# Patient Record
Sex: Female | Born: 1960
Health system: Southern US, Community
[De-identification: ages and names within clinical notes are randomized; demographics above are authoritative.]

## PROBLEM LIST (undated history)

## (undated) DIAGNOSIS — R87619 Unspecified abnormal cytological findings in specimens from cervix uteri: Secondary | ICD-10-CM

## (undated) DIAGNOSIS — Z5189 Encounter for other specified aftercare: Secondary | ICD-10-CM

## (undated) DIAGNOSIS — T7840XA Allergy, unspecified, initial encounter: Secondary | ICD-10-CM

## (undated) DIAGNOSIS — F419 Anxiety disorder, unspecified: Secondary | ICD-10-CM

## (undated) DIAGNOSIS — R58 Hemorrhage, not elsewhere classified: Secondary | ICD-10-CM

## (undated) DIAGNOSIS — Z9889 Other specified postprocedural states: Secondary | ICD-10-CM

## (undated) HISTORY — PX: WISDOM TOOTH EXTRACTION: SHX21

## (undated) HISTORY — DX: Hemorrhage, not elsewhere classified: R58

## (undated) HISTORY — DX: Anxiety disorder, unspecified: F41.9

## (undated) HISTORY — DX: Other specified postprocedural states: Z98.890

## (undated) HISTORY — DX: Encounter for other specified aftercare: Z51.89

## (undated) HISTORY — PX: TUBAL LIGATION: SHX77

## (undated) HISTORY — DX: Unspecified abnormal cytological findings in specimens from cervix uteri: R87.619

## (undated) HISTORY — DX: Allergy, unspecified, initial encounter: T78.40XA

## (undated) HISTORY — PX: COLONOSCOPY: SHX174

---

## 1984-05-07 DIAGNOSIS — Z9889 Other specified postprocedural states: Secondary | ICD-10-CM

## 1984-05-07 HISTORY — DX: Other specified postprocedural states: Z98.890

## 2003-12-06 ENCOUNTER — Encounter: Payer: Self-pay | Admitting: Family Medicine

## 2003-12-06 LAB — CONVERTED CEMR LAB: Pap Smear: NORMAL

## 2005-01-16 ENCOUNTER — Encounter: Payer: Self-pay | Admitting: Family Medicine

## 2005-02-09 ENCOUNTER — Encounter: Payer: Self-pay | Admitting: Family Medicine

## 2005-02-09 ENCOUNTER — Encounter (INDEPENDENT_AMBULATORY_CARE_PROVIDER_SITE_OTHER): Payer: Self-pay | Admitting: *Deleted

## 2005-02-09 ENCOUNTER — Ambulatory Visit (HOSPITAL_COMMUNITY): Admission: RE | Admit: 2005-02-09 | Discharge: 2005-02-09 | Payer: Self-pay | Admitting: Gastroenterology

## 2005-07-17 ENCOUNTER — Emergency Department (HOSPITAL_COMMUNITY): Admission: EM | Admit: 2005-07-17 | Discharge: 2005-07-17 | Payer: Self-pay | Admitting: Family Medicine

## 2006-05-17 ENCOUNTER — Ambulatory Visit: Payer: Self-pay | Admitting: Family Medicine

## 2006-05-17 LAB — CONVERTED CEMR LAB
AST: 19 units/L (ref 0–37)
CO2: 29 meq/L (ref 19–32)
Chol/HDL Ratio, serum: 2.8
Cholesterol: 176 mg/dL (ref 0–200)
GFR calc non Af Amer: 73 mL/min
Glomerular Filtration Rate, Af Am: 88 mL/min/{1.73_m2}
HDL: 62.5 mg/dL (ref >39.0)
Potassium: 3.7 meq/L (ref 3.5–5.1)
Triglyceride fasting, serum: 48 mg/dL (ref 0–149)

## 2006-05-27 ENCOUNTER — Ambulatory Visit (HOSPITAL_COMMUNITY): Admission: RE | Admit: 2006-05-27 | Discharge: 2006-05-27 | Payer: Self-pay | Admitting: Family Medicine

## 2006-06-05 ENCOUNTER — Encounter: Admission: RE | Admit: 2006-06-05 | Discharge: 2006-06-05 | Payer: Self-pay | Admitting: Family Medicine

## 2006-12-02 ENCOUNTER — Encounter: Admission: RE | Admit: 2006-12-02 | Discharge: 2006-12-02 | Payer: Self-pay | Admitting: Family Medicine

## 2007-02-28 ENCOUNTER — Encounter: Payer: Self-pay | Admitting: Family Medicine

## 2007-02-28 DIAGNOSIS — R87619 Unspecified abnormal cytological findings in specimens from cervix uteri: Secondary | ICD-10-CM | POA: Insufficient documentation

## 2007-02-28 DIAGNOSIS — Z8719 Personal history of other diseases of the digestive system: Secondary | ICD-10-CM | POA: Insufficient documentation

## 2007-06-19 ENCOUNTER — Ambulatory Visit: Payer: Self-pay | Admitting: Family Medicine

## 2007-06-19 DIAGNOSIS — R1013 Epigastric pain: Secondary | ICD-10-CM | POA: Insufficient documentation

## 2007-06-20 ENCOUNTER — Ambulatory Visit: Payer: Self-pay | Admitting: Family Medicine

## 2007-06-20 ENCOUNTER — Ambulatory Visit (HOSPITAL_COMMUNITY): Admission: RE | Admit: 2007-06-20 | Discharge: 2007-06-20 | Payer: Self-pay | Admitting: Family Medicine

## 2007-06-20 LAB — CONVERTED CEMR LAB
AST: 17 units/L (ref 0–37)
Albumin: 4.3 g/dL (ref 3.5–5.2)
BUN: 8 mg/dL (ref 6–23)
Basophils Absolute: 0 10*3/uL (ref 0.0–0.1)
Basophils Relative: 0.4 % (ref 0.0–1.0)
Calcium: 9.6 mg/dL (ref 8.4–10.5)
Chloride: 102 meq/L (ref 96–112)
Creatinine, Ser: 0.9 mg/dL (ref 0.4–1.2)
Eosinophils Absolute: 0.1 10*3/uL (ref 0.0–0.6)
Eosinophils Relative: 0.9 % (ref 0.0–5.0)
Hemoglobin: 14.5 g/dL (ref 12.0–15.0)
MCHC: 33.6 g/dL (ref 30.0–36.0)
MCV: 91 fL (ref 78.0–100.0)
Monocytes Absolute: 0.4 10*3/uL (ref 0.2–0.7)
Monocytes Relative: 7.4 % (ref 3.0–11.0)
Potassium: 3.9 meq/L (ref 3.5–5.1)
RDW: 11.8 % (ref 11.5–14.6)
Sodium: 140 meq/L (ref 135–145)
Total Protein: 7.6 g/dL (ref 6.0–8.3)
WBC: 5.8 10*3/uL (ref 4.5–10.5)

## 2007-06-24 ENCOUNTER — Encounter: Payer: Self-pay | Admitting: Family Medicine

## 2007-07-02 ENCOUNTER — Encounter: Payer: Self-pay | Admitting: Family Medicine

## 2007-07-02 ENCOUNTER — Ambulatory Visit: Payer: Self-pay | Admitting: Family Medicine

## 2007-07-02 ENCOUNTER — Other Ambulatory Visit: Admission: RE | Admit: 2007-07-02 | Discharge: 2007-07-02 | Payer: Self-pay | Admitting: Family Medicine

## 2007-07-15 ENCOUNTER — Ambulatory Visit (HOSPITAL_COMMUNITY): Admission: RE | Admit: 2007-07-15 | Discharge: 2007-07-15 | Payer: Self-pay | Admitting: Family Medicine

## 2007-08-01 ENCOUNTER — Telehealth: Payer: Self-pay | Admitting: Family Medicine

## 2007-08-20 ENCOUNTER — Ambulatory Visit: Payer: Self-pay | Admitting: Family Medicine

## 2007-08-20 DIAGNOSIS — N3000 Acute cystitis without hematuria: Secondary | ICD-10-CM | POA: Insufficient documentation

## 2007-08-20 LAB — CONVERTED CEMR LAB: Urobilinogen, UA: 0.2

## 2007-11-23 ENCOUNTER — Encounter: Payer: Self-pay | Admitting: Family Medicine

## 2007-12-01 ENCOUNTER — Ambulatory Visit: Payer: Self-pay | Admitting: Family Medicine

## 2007-12-01 DIAGNOSIS — G589 Mononeuropathy, unspecified: Secondary | ICD-10-CM | POA: Insufficient documentation

## 2007-12-01 DIAGNOSIS — R071 Chest pain on breathing: Secondary | ICD-10-CM | POA: Insufficient documentation

## 2007-12-31 ENCOUNTER — Ambulatory Visit: Payer: Self-pay | Admitting: Family Medicine

## 2007-12-31 DIAGNOSIS — N63 Unspecified lump in unspecified breast: Secondary | ICD-10-CM | POA: Insufficient documentation

## 2008-01-02 ENCOUNTER — Encounter: Admission: RE | Admit: 2008-01-02 | Discharge: 2008-01-02 | Payer: Self-pay | Admitting: Family Medicine

## 2008-01-27 ENCOUNTER — Ambulatory Visit: Payer: Self-pay | Admitting: Family Medicine

## 2008-01-27 DIAGNOSIS — M549 Dorsalgia, unspecified: Secondary | ICD-10-CM | POA: Insufficient documentation

## 2008-04-14 ENCOUNTER — Ambulatory Visit: Payer: Self-pay | Admitting: Family Medicine

## 2008-04-14 DIAGNOSIS — J019 Acute sinusitis, unspecified: Secondary | ICD-10-CM | POA: Insufficient documentation

## 2008-07-07 ENCOUNTER — Ambulatory Visit: Payer: Self-pay | Admitting: Family Medicine

## 2008-07-07 DIAGNOSIS — R5383 Other fatigue: Secondary | ICD-10-CM | POA: Insufficient documentation

## 2008-07-07 DIAGNOSIS — J309 Allergic rhinitis, unspecified: Secondary | ICD-10-CM | POA: Insufficient documentation

## 2008-07-07 DIAGNOSIS — R5381 Other malaise: Secondary | ICD-10-CM | POA: Insufficient documentation

## 2008-07-08 ENCOUNTER — Ambulatory Visit: Payer: Self-pay | Admitting: Family Medicine

## 2008-07-14 ENCOUNTER — Encounter: Payer: Self-pay | Admitting: Family Medicine

## 2008-07-14 LAB — CONVERTED CEMR LAB
ALT: 12 units/L (ref 0–35)
AST: 17 units/L (ref 0–37)
BUN: 7 mg/dL (ref 6–23)
Basophils Absolute: 0 10*3/uL (ref 0.0–0.1)
Basophils Relative: 0.7 % (ref 0.0–3.0)
Bilirubin, Direct: 0.1 mg/dL (ref 0.0–0.3)
Calcium: 9.2 mg/dL (ref 8.4–10.5)
Chloride: 105 meq/L (ref 96–112)
Cholesterol: 206 mg/dL (ref 0–200)
Creatinine, Ser: 0.8 mg/dL (ref 0.4–1.2)
Direct LDL: 125.7 mg/dL
Eosinophils Relative: 2.7 % (ref 0.0–5.0)
GFR calc Af Amer: 100 mL/min
GFR calc non Af Amer: 82 mL/min
Glucose, Bld: 95 mg/dL (ref 70–99)
HDL: 58.7 mg/dL (ref >39.0)
Hemoglobin: 14 g/dL (ref 12.0–15.0)
Lymphocytes Relative: 42.2 % (ref 12.0–46.0)
Monocytes Relative: 8.1 % (ref 3.0–12.0)
Neutrophils Relative %: 46.3 % (ref 43.0–77.0)
Platelets: 221 10*3/uL (ref 150–400)
TSH: 2.92 microintl units/mL (ref 0.35–5.50)
Total Bilirubin: 0.9 mg/dL (ref 0.3–1.2)
Total Protein: 6.6 g/dL (ref 6.0–8.3)
Vit D, 25-Hydroxy: 25 ng/mL — ABNORMAL LOW (ref 30–89)
Vitamin B-12: 338 pg/mL (ref 211–911)
WBC: 3.7 10*3/uL — ABNORMAL LOW (ref 4.5–10.5)

## 2008-07-29 ENCOUNTER — Ambulatory Visit: Payer: Self-pay | Admitting: Family Medicine

## 2008-07-29 LAB — CONVERTED CEMR LAB
Eosinophils Absolute: 0.1 10*3/uL (ref 0.0–0.7)
Eosinophils Relative: 1.8 % (ref 0.0–5.0)
Lymphs Abs: 1.5 10*3/uL (ref 0.7–4.0)
MCHC: 33.7 g/dL (ref 30.0–36.0)
Monocytes Absolute: 0.4 10*3/uL (ref 0.1–1.0)

## 2008-09-27 ENCOUNTER — Other Ambulatory Visit: Admission: RE | Admit: 2008-09-27 | Discharge: 2008-09-27 | Payer: Self-pay | Admitting: Family Medicine

## 2008-09-27 ENCOUNTER — Encounter: Payer: Self-pay | Admitting: Family Medicine

## 2008-09-27 ENCOUNTER — Ambulatory Visit: Payer: Self-pay | Admitting: Family Medicine

## 2008-10-19 ENCOUNTER — Ambulatory Visit (HOSPITAL_COMMUNITY): Admission: RE | Admit: 2008-10-19 | Discharge: 2008-10-19 | Payer: Self-pay | Admitting: Family Medicine

## 2008-10-20 ENCOUNTER — Telehealth: Payer: Self-pay | Admitting: Family Medicine

## 2008-10-20 ENCOUNTER — Ambulatory Visit: Payer: Self-pay | Admitting: Family Medicine

## 2009-02-04 ENCOUNTER — Ambulatory Visit: Payer: Self-pay | Admitting: Family Medicine

## 2009-08-03 ENCOUNTER — Telehealth: Payer: Self-pay | Admitting: Family Medicine

## 2009-11-24 ENCOUNTER — Ambulatory Visit (HOSPITAL_COMMUNITY): Admission: RE | Admit: 2009-11-24 | Discharge: 2009-11-24 | Payer: Self-pay | Admitting: Family Medicine

## 2010-03-22 ENCOUNTER — Encounter (INDEPENDENT_AMBULATORY_CARE_PROVIDER_SITE_OTHER): Payer: Self-pay | Admitting: *Deleted

## 2010-03-24 ENCOUNTER — Ambulatory Visit: Payer: Self-pay | Admitting: Internal Medicine

## 2010-04-07 ENCOUNTER — Ambulatory Visit: Payer: Self-pay | Admitting: Internal Medicine

## 2010-05-28 ENCOUNTER — Encounter: Payer: Self-pay | Admitting: Family Medicine

## 2010-05-29 ENCOUNTER — Encounter: Payer: Self-pay | Admitting: Family Medicine

## 2010-06-06 NOTE — Procedures (Signed)
Summary: Colonoscopy and Polypectomy by Dr.James Edwards,[facility]  Colonoscopy and Polypectomy by Dr.James Edwards,[facility]   Imported By: Beau Fanny 04/06/2010 10:50:29  _____________________________________________________________________  External Attachment:    Type:   Image     Comment:   External Document

## 2010-06-06 NOTE — Letter (Signed)
Summary: Dr.James Edward,[facility],Note  Dr.James Edward,[facility],Note   Imported By: Beau Fanny 04/06/2010 10:51:13  _____________________________________________________________________  External Attachment:    Type:   Image     Comment:   External Document

## 2010-06-06 NOTE — Progress Notes (Signed)
Summary: allegra  Phone Note Call from Patient   Caller: Patient Call For: Kerby Nora MD Summary of Call: Patient would like rx for allergra so that flex spending will pay Initial call taken by: Benny Lennert CMA Duncan Dull),  August 03, 2009 9:39 AM  Follow-up for Phone Call        Okay for year supply #30.  Follow-up by: Kerby Nora MD,  August 03, 2009 1:02 PM  Additional Follow-up for Phone Call Additional follow up Details #1::        done and patient notified Additional Follow-up by: Benny Lennert CMA Duncan Dull),  August 03, 2009 2:14 PM    New/Updated Medications: ALLEGRA 180 MG TABS (FEXOFENADINE HCL) take one tablet daily Prescriptions: ALLEGRA 180 MG TABS (FEXOFENADINE HCL) take one tablet daily  #30 x 11   Entered by:   Benny Lennert CMA (AAMA)   Authorized by:   Kerby Nora MD   Signed by:   Benny Lennert CMA (AAMA) on 08/03/2009   Method used:   [facility]        [facility]  [address] [id number]       [address]       [place], [place]  [address]       Ph: [phone] or [phone]       Fax: [phone]   RxID:   [id number]   Prior Medications: KETOCONAZOLE 2 % CREA (KETOCONAZOLE) Apply a small amount to affected area twice a day as needed CALTRATE 600 1500 MG  TABS (CALCIUM CARBONATE) 1 by mouth two times a day ZANAFLEX 4 MG TABS (TIZANIDINE HCL) 1 by mouth qhs DICLOFENAC SODIUM 75 MG TBEC (DICLOFENAC SODIUM) 1 by mouth two times a day CALCIUM CARBONATE-VITAMIN D 600-400 MG-UNIT  TABS (CALCIUM CARBONATE-VITAMIN D) Take 1 tablet by mouth once a day RHINOCORT AQUA 32 MCG/ACT SUSP (BUDESONIDE) 2 sprays per nostril  daily VITAMIN D 50000 UNIT CAPS (ERGOCALCIFEROL) Take one capsule by mouth once a week Current Allergies: ! CEFTIN CEFTIN (CEFUROXIME AXETIL)

## 2010-06-06 NOTE — Letter (Signed)
Summary: [facility] Instructions  [facility]  [address] [place], [place] [address]   Phone: [phone]  Fax: [phone]       [name]    February 17, 1961    MRN: [id number]       Procedure Day /Date:  Friday 04/07/2010     Arrival Time: 8:30 am     Procedure Time:  9:30 am     Location of Procedure:                    _x _  [facility] (4th Floor)   PREPARATION FOR COLONOSCOPY WITH MIRALAX  Starting 5 days prior to your procedure Sunday 11/27 do not eat nuts, seeds, popcorn, corn, beans, peas,  salads, or any raw vegetables.  Do not take any fiber supplements (e.g. Metamucil, Citrucel, and Benefiber). ____________________________________________________________________________________________________   THE DAY BEFORE YOUR PROCEDURE         DATE: Thursday 12/1  1   Drink clear liquids the entire day-NO SOLID FOOD  2   Do not drink anything colored red or purple.  Avoid juices with pulp.  No orange juice.  3   Drink at least 64 oz. (8 glasses) of fluid/clear liquids during the day to prevent dehydration and help the prep work efficiently.  CLEAR LIQUIDS INCLUDE: Water Jello Ice Popsicles Tea (sugar ok, no milk/cream) Powdered fruit flavored drinks Coffee (sugar ok, no milk/cream) Gatorade Juice: apple, white grape, white cranberry  Lemonade Clear bullion, consomm, broth Carbonated beverages (any kind) Strained chicken noodle soup Hard Candy  4   Mix the entire bottle of Miralax with 64 oz. of Gatorade/Powerade in the morning and put in the refrigerator to chill.  5   At 3:00 pm take 2 Dulcolax/Bisacodyl tablets.  6   At 4:30 pm take one Reglan/Metoclopramide tablet.  7  Starting at 5:00 pm drink one 8 oz glass of the Miralax mixture every 15-20 minutes until you have finished drinking the entire 64 oz.  You should finish drinking prep around 7:30 or 8:00 pm.  8   If you are nauseated, you may take the 2nd Reglan/Metoclopramide tablet  at 6:30 pm.        9    At 8:00 pm take 2 more DULCOLAX/Bisacodyl tablets.     THE DAY OF YOUR PROCEDURE      DATE:  Friday 12/2  You may drink clear liquids until 7:30 am  (2 HOURS BEFORE PROCEDURE).   MEDICATION INSTRUCTIONS  Unless otherwise instructed, you should take regular prescription medications with a small sip of water as early as possible the morning of your procedure.            OTHER INSTRUCTIONS  You will need a responsible adult at least 48 years of age to accompany you and drive you home.   This person must remain in the waiting room during your procedure.  Wear loose fitting clothing that is easily removed.  Leave jewelry and other valuables at home.  However, you may wish to bring a book to read or an iPod/MP3 player to listen to music as you wait for your procedure to start.  Remove all body piercing jewelry and leave at home.  Total time from sign-in until discharge is approximately 2-3 hours.  You should go home directly after your procedure and rest.  You can resume normal activities the day after your procedure.  The day of your procedure you should not:   Drive  Make legal decisions   Operate machinery   Drink alcohol   Return to work  You will receive specific instructions about eating, activities and medications before you leave.   The above instructions have been reviewed and explained to me by  Sherren Kerns RN  March 24, 2010 4:37 PM     I fully understand and can verbalize these instructions _____________________________ Date _______

## 2010-06-06 NOTE — Miscellaneous (Signed)
Summary: previsit prep/rm  Clinical Lists Changes  Medications: Added new medication of MIRALAX   POWD (POLYETHYLENE GLYCOL 3350) As per prep  instructions. - Signed Added new medication of DULCOLAX 5 MG  TBEC (BISACODYL) Day before procedure take 2 at 3pm and 2 at 8pm. - Signed Added new medication of REGLAN 10 MG  TABS (METOCLOPRAMIDE HCL) As per prep instructions. - Signed Rx of MIRALAX   POWD (POLYETHYLENE GLYCOL 3350) As per prep  instructions.;  #255gm x 0;  Signed;  Entered by: Sherren Kerns RN;  Authorized by: Hart Carwin MD;  Method used: [facility] [facility] [address], [place], [place]  [address], Ph: [phone] or [phone], Fax: [phone] Rx of DULCOLAX 5 MG  TBEC (BISACODYL) Day before procedure take 2 at 3pm and 2 at 8pm.;  #4 x 0;  Signed;  Entered by: Sherren Kerns RN;  Authorized by: Hart Carwin MD;  Method used: [facility]  [address] [place], [place]  [address], Ph: [phone] or [phone], Fax: [phone] Rx of REGLAN 10 MG  TABS (METOCLOPRAMIDE HCL) As per prep instructions.;  #2 x 0;  Signed;  Entered by: Sherren Kerns RN;  Authorized by: Hart Carwin MD;  Method used: [facility] [place], [place]  [address], Ph: [phone] or [phone], Fax: [phone] Observations: Added new observation of ALLERGY REV: Done (03/24/2010 16:17)    Prescriptions: REGLAN 10 MG  TABS (METOCLOPRAMIDE HCL) As per prep instructions.  #2 x 0   Entered by:   Sherren Kerns RN   Authorized by:   Hart Carwin MD   Signed by:   Sherren Kerns RN on 03/24/2010   Method used:   [facility]ron[facility]        [facility] (retail)       [address]       [place], [place]  [address]       Ph: [phone] or [phone]       Fax: [phone]   RxID:   [id number] DULCOLAX 5 MG  TBEC (BISACODYL) Day before procedure take 2 at 3pm and 2 at 8pm.  #4  x 0   Entered by:   Sherren Kerns RN   Authorized by:   Hart Carwin MD   Signed by:   Sherren Kerns RN on 03/24/2010   Method used:   [facility]ronically to        [facility]  [address] (retail)       [address]       [place], [place]  [address]       Ph: [phone] or [phone]       Fax: [phone]   RxID:   [id number] MIRALAX   POWD (POLYETHYLENE GLYCOL 3350) As per prep  instructions.  #255gm x 0   Entered by:   Sherren Kerns RN   Authorized by:   Hart Carwin MD   Signed by:   Sherren Kerns RN on 03/24/2010   Method used:   [facility]        [facility] (retail)       [address]       [place], [place]  [address]       Ph: [phone] or [phone]       Fax: [phone]   RxID:   [id number]

## 2010-06-06 NOTE — Procedures (Signed)
Summary: Colonoscopy  Patient: [name] Note: All result statuses are Final unless otherwise noted.  Tests: (1) Colonoscopy (COL)   COL Colonoscopy           DONE (C)     [facility]     [address]     [place], [place]  [address]           COLONOSCOPY PROCEDURE REPORT           PATIENT:  [name], [name]  MR#:  [id number]     BIRTHDATE:  29-May-1960, 49 yrs. old  GENDER:  female     ENDOSCOPIST:  Hedwig Morton. Juanda Chance, MD     REF. BY:  Excell Seltzer, M.D.     PROCEDURE DATE:  04/07/2010     PROCEDURE:  Colonoscopy [address]     ASA CLASS:  Class I     INDICATIONS:  family history of colon cancer, Hx of polyps on     colonoscopy 5 years ago,  mat aunt with colon cancer,     MEDICATIONS:   Versed 10 mg, Fentanyl 100 mcg           DESCRIPTION OF PROCEDURE:   After the risks benefits and     alternatives of the procedure were thoroughly explained, informed     consent was obtained.  Digital rectal exam was performed and     revealed no rectal masses.   The LB [id number] endoscope     was introduced through the anus and advanced to the cecum, which     was identified by both the appendix and ileocecal valve, without     limitations.  The quality of the prep was excellent, using     MiraLax.  The instrument was then slowly withdrawn as the colon     was fully examined.     <<PROCEDUREIMAGES>>           FINDINGS:  No polyps or cancers were seen (see image1, image2,     image3, image4, and image5).   Retroflexed views in the rectum     revealed no abnormalities.    The scope was then withdrawn from     the patient and the procedure completed.           COMPLICATIONS:  None     ENDOSCOPIC IMPRESSION:     1) No polyps or cancers     2) Normal colonoscopy     RECOMMENDATIONS:     1) high fiber diet     REPEAT EXAM:  In 10 year(s) for.           ______________________________     Hedwig Morton. Juanda Chance, MD           CC:           n.     REVISED:  04/07/2010 09:38 AM  eSIGNED:   Hedwig Morton. Brodie at 04/07/2010 09:38 AM           Carrie Mew, [id number]  Note: An exclamation mark (!) indicates a result that was not dispersed into the flowsheet. Document Creation Date: 04/07/2010 9:38 AM _______________________________________________________________________  (1) Order result status: Final Collection or observation date-time: 04/07/2010 09:21 Requested date-time:  Receipt date-time:  Reported date-time:  Referring Physician:   Ordering Physician: Lina Sar [id number]) Specimen Source:  Source: Launa Grill Order Number: [id number] Lab site:   Appended Document: Colonoscopy    Clinical Lists Changes  Observations: Added new observation of COLONNXTDUE:  04/2020 (04/07/2010 13:14)     

## 2010-07-12 ENCOUNTER — Encounter: Payer: Self-pay | Admitting: Family Medicine

## 2010-07-12 ENCOUNTER — Ambulatory Visit (INDEPENDENT_AMBULATORY_CARE_PROVIDER_SITE_OTHER): Payer: Self-pay | Admitting: Family Medicine

## 2010-07-12 DIAGNOSIS — J069 Acute upper respiratory infection, unspecified: Secondary | ICD-10-CM

## 2010-07-13 ENCOUNTER — Encounter (INDEPENDENT_AMBULATORY_CARE_PROVIDER_SITE_OTHER): Payer: Self-pay | Admitting: *Deleted

## 2010-07-13 ENCOUNTER — Encounter: Payer: Self-pay | Admitting: Family Medicine

## 2010-07-13 DIAGNOSIS — J329 Chronic sinusitis, unspecified: Secondary | ICD-10-CM

## 2010-07-18 NOTE — Assessment & Plan Note (Signed)
Summary: Recheck-feels worse   Vital Signs:  Patient profile:   50 year old female Weight:      144 pounds O2 Sat:      97 % on Room air Pulse rate:   120 / minute Pulse rhythm:   regular BP supine:   130 / 88  Vitals Entered By: Selena Batten Dance CMA Duncan Dull) (July 13, 2010 8:37 AM)  O2 Flow:  Room air CC: Feels worse than yesterday   History of Present Illness: CC: not better  seen ysterday with dx URI.  today worsening cough productive of yellow sputum, worsening head congestion and facial pain and purulent discharge.  + chest pain with cough.  no fever.  tessalon helping some.  has not filled cheratussin.  Allergies: 1)  ! Ceftin 2)  Ceftin (Cefuroxime Axetil)  Review of Systems       per HPI  Physical Exam  General:  tired, some congestion Head:  Normocephalic and atraumatic without obvious abnormalities. No apparent alopecia or balding.  no sinus tenderness Eyes:  PERRLA, EOMI, no injection Mouth:  MMM, mild soft palate erythema L side, no pharyngeal erythema.  mild PND Neck:  no carotid bruit or thyromegaly  no cervical or supraclavicular lymphadenopathy  Lungs:  Normal respiratory effort, chest expands symmetrically. Lungs are clear to auscultation, no crackles or wheezes. Heart:  Normal rate and regular rhythm. S1 and S2 normal without gallop, murmur, click, rub or other extra sounds.  tachycardic Pulses:  2+ rad pulses bilaterally, brisk cap refill   Impression & Recommendations:  Problem # 1:  SINUSITIS (ICD-473.9)  developing sinusitis/bronchitis.  as acutely worse, cover with zpack.  advised to call us tomorrow with update, use mucinex and nasal saline.  out of work, push fluids and rest.  Her updated medication list for this problem includes:    Tessalon Perles 100 Mg Caps (Benzonatate) .Marland Kitchen... Take one by mouth three times a day as needed cough, swallow don't chew    Cheratussin Ac 100-10 Mg/28ml Syrp (Guaifenesin-codeine) ..... One teaspoon q6 hours as  needed cough    Zithromax Z-pak 250 Mg Tabs (Azithromycin) ..... Use as directed  Orders: No Charge Patient Arrived (NCPA0) (NCPA0)  Complete Medication List: 1)  Allegra 180 Mg Tabs (Fexofenadine hcl) .... Take one tablet daily 2)  Tessalon Perles 100 Mg Caps (Benzonatate) .... Take one by mouth three times a day as needed cough, swallow don't chew 3)  Cheratussin Ac 100-10 Mg/63ml Syrp (Guaifenesin-codeine) .... One teaspoon q6 hours as needed cough 4)  Zithromax Z-pak 250 Mg Tabs (Azithromycin) .... Use as directed  Patient Instructions: 1)  This may be developing sinusitis. 2)  Out of work today and tomorrow - get plenty of rest and fluids. 3)  Call us if not starting to feel better tomorrow. 4)  Try simple mucinex or guaifenesin IR 400mg  with plenty of fluid to help mobilize mucous.  Use nasal saline spray. 5)  take azithromycin (sent to pharmacy). Prescriptions: ZITHROMAX Z-PAK 250 MG TABS (AZITHROMYCIN) use as directed  #1 x 0   Entered and Authorized by:   Eustaquio Boyden  MD   Signed by:   Eustaquio Boyden  MD on 07/13/2010   Method used:   [facility]        [facility])       [address]       [place], [place]  [address]       Ph: [phone] or [phone]  Fax: [phone]   RxID:   [id number]    Orders Added: 1)  No Charge Patient Arrived (NCPA0) [NCPA0]    Current Allergies (reviewed today): ! CEFTIN CEFTIN (CEFUROXIME AXETIL)

## 2010-07-18 NOTE — Letter (Signed)
Summary: Out of Work  [facility]  [address] [place], [place] [address]   Phone: [phone]  Fax: [phone]    July 13, 2010   Employee:  Von Tuft    To Whom It May Concern:   For Medical reasons, please excuse the above named employee from work for the following dates:  Start:  July 13, 2010   End:  July 14, 2010 (May return on July 17, 2010)  If you need additional information, please feel free to contact our office.         Sincerely,       Kim Dance CMA (AAMA) for  Dr, Eustaquio Boyden

## 2010-07-18 NOTE — Assessment & Plan Note (Signed)
Summary: CONGESTION/COUGH/[facility]H   Vital Signs:  Patient profile:   50 year old female Temp:     98.1 degrees F oral Pulse rate:   66 / minute Pulse rhythm:   regular BP sitting:   116 / 70  (left arm) Cuff size:   regular  Vitals Entered By: Selena Batten Dance CMA Duncan Dull) (July 12, 2010 11:33 AM) CC: Congestion/cough   History of Present Illness: CC: cough, pressure  6d h/o ST, HA, occipital.  congestion and cough started more recently (3d ago).  Initially clear coughing, now turning yellow.  Has tried Catering manager, zycam, vit C, echinacia, advil.  + intiially arthralgia but better.  Today noticing sinus pressure.  Out of nose noticing bloody discharge.  Worse at night.  not keeping up at night.  No abd pain, n/v/d, myalgias, rashes.  No fevers/chills.  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  No smokers at home.  + h/o allergies, on allegra, usually well controlled with this..  no asthma.  Current Medications (verified): 1)  Allegra 180 Mg Tabs (Fexofenadine Hcl) .... Take One Tablet Daily  Allergies: 1)  ! Ceftin 2)  Ceftin (Cefuroxime Axetil)  Past History:  Past Medical History: Last updated: 06/19/2007 Current Problems:  GASTROINTESTINAL HEMORRHAGE, HX OF (ICD-V12.79) associated with GI infection ABNORMAL PAP SMEAR (ICD-795.00)    Social History: Last updated: 04/14/2008 Never Smoked Occupation: Futures trader Married 2 kids Alcohol use-no Drug use-no Regular exercise-yes  Review of Systems       per HPI  Physical Exam  General:  Well-developed,well-nourished,in no acute distress; alert,appropriate and cooperative throughout examination Head:  Normocephalic and atraumatic without obvious abnormalities. No apparent alopecia or balding.  no sinus tenderness Eyes:  PERRLA, EOMI, no injection Ears:  TMs clear bilaterally Nose:  nares clear bilaterally Mouth:  MMM, mild soft palate erythema L side, no pharyngeal erythema.  mild  PND Neck:  no carotid bruit or thyromegaly  no cervical or supraclavicular lymphadenopathy  Lungs:  Normal respiratory effort, chest expands symmetrically. Lungs are clear to auscultation, no crackles or wheezes. Heart:  Normal rate and regular rhythm. S1 and S2 normal without gallop, murmur, click, rub or other extra sounds. Pulses:  2+ rad pulses bilaterally, brisk cap refill Extremities:  no edema Skin:  Intact without suspicious lesions or rashes   Impression & Recommendations:  Problem # 1:  VIRAL URI (ICD-465.9) supportive care.  red flags to return discussed.  tessalon at work, cheratussin at night.  The following medications were removed from the medication list:    Diclofenac Sodium 75 Mg Tbec (Diclofenac sodium) .Marland Kitchen... 1 by mouth two times a day Her updated medication list for this problem includes:    Allegra 180 Mg Tabs (Fexofenadine hcl) .Marland Kitchen... Take one tablet daily    Tessalon Perles 100 Mg Caps (Benzonatate) .Marland Kitchen... Take one by mouth three times a day as needed cough, swallow don't chew    Cheratussin Ac 100-10 Mg/33ml Syrp (Guaifenesin-codeine) ..... One teaspoon q6 hours as needed cough  Complete Medication List: 1)  Allegra 180 Mg Tabs (Fexofenadine hcl) .... Take one tablet daily 2)  Tessalon Perles 100 Mg Caps (Benzonatate) .... Take one by mouth three times a day as needed cough, swallow don't chew 3)  Cheratussin Ac 100-10 Mg/68ml Syrp (Guaifenesin-codeine) .... One teaspoon q6 hours as needed cough  Patient Instructions: 1)  Sounds like you have a viral upper respiratory infection. 2)  Antibiotics are not needed for  this.  Viral infections usually take 7-10 days to resolve.  The cough can last 4 weeks to go away. 3)  Please return if you are not improving as expected, or if you have high fevers (>101.5) or difficulty swallowing. 4)  Call clinic with questions.  Pleasure to see you today!  5)  tessalon perls for cough during day. 6)  cheratussin cough syrup at night.   may take during day if not making you too sleepy. Prescriptions: CHERATUSSIN AC 100-10 MG/5ML SYRP (GUAIFENESIN-CODEINE) one teaspoon q6 hours as needed cough  #100cc x 0   Entered and Authorized by:   Eustaquio Boyden  MD   Signed by:   Eustaquio Boyden  MD on 07/12/2010   Method used:   Print then Give to Patient   RxID:   [id number] TESSALON PERLES 100 MG CAPS (BENZONATATE) take one by mouth three times a day as needed cough, swallow don't chew  #45 x 0   Entered and Authorized by:   Eustaquio Boyden  MD   Signed by:   Eustaquio Boyden  MD on 07/12/2010   Method used:   [facility]        [facility] [facility]       [address]       [place], [place]  [address]       Ph: [phone] or [phone]       Fax: [phone]   RxID:   [id number]    Orders Added: 1)  Est. Patient Level III [[address]]    Current Allergies (reviewed today): ! CEFTIN CEFTIN (CEFUROXIME AXETIL)

## 2010-09-22 NOTE — Assessment & Plan Note (Signed)
[facility]                           [facility] OFFICE NOTE   NAME:HAYESKemba, [name]                        MRN:          [id number]  DATE:05/17/2006                            DOB:          1961-04-10    CHIEF COMPLAINT:  50 year old white female here to establish new doctor.   HISTORY OF PRESENT ILLNESS:  [name] was seen by her previous doctors  about 2-3 years ago. She states that she has been in good health since.  Her only concerns today are the following:  1. 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 The blood sugar noted was      146. This was at approximately 3:30pm after she had eaten a normal      sized lunch. She was concerned because she felt that this was      elevated above normal. She denies any increased thirst, increased      urination, and has good levels of energy.  2. Rash, acute: Ever since this summer, she has had recurrence of a      pale, hypopigmented, spotted rash across her chest and abdomen. She      has had this in the past and it was treated by a dermatologist. It      is not itchy and does not bother her.   REVIEW OF SYSTEMS:  No headache, no dizziness, no chest pain, no  shortness, no nausea, vomiting, diarrhea, constipation, or rectal  bleeding.   PAST MEDICAL HISTORY:  1. Abnormal pap, greater than 10 years ago.  2. History of GI bleed, secondary to bacterial infection.   HOSPITALIZATIONS/SURGERIES/PROCEDURES:  1. 1999 echocardiogram negative.  2. NSNBD x3.  3. 1986 cervical biopsy resulting in hemorrhage, biopsy was negative.  4. Mammogram in 2005, negative.  5. Pap smear in 2005, negative.  6. Colonoscopy 02/05/2006, negative.   ALLERGIES:  CEFTIN causes hives.   MEDICATIONS:  None.   SOCIAL HISTORY:  She is a former smoker with a 6-7 pack year history,  but she quit 18 years ago. She drinks a moderate amount of alcohol, 1-2  drinks per  day. 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 She has been married for 18 years happily.  She has three children who are healthy. She does not get any regular  exercise. She eats fairly healthily with fruits, vegetables, and lots of  water and tries to avoid fast food.   FAMILY HISTORY:  Her father is alive at 62 with high cholesterol. Her  mother is alive at age 70 and is healthy. No heart attack in the family  before age 79. She has 2 brothers who are healthy. No coronary artery  disease or high blood pressure in the family. She thinks a maternal aunt  may have had diabetes. A paternal aunt also developed breast cancer at  around age 14, as well as a grandmother's twin sister at around age 86.  A maternal aunt also had  colon cancer. Her paternal grandfather had lung  cancer.'   PHYSICAL EXAMINATION:  VITAL SIGNS: Height 67 inches, weight 135.8  making BMI approximately 21, pulse 88, temperature 98.  GENERAL: Healthy appearing female in no apparent distress.  HEENT: PERRLA, extraocular muscles intact, oral pharynx clear, tympanic  membranes clear, nares clear, no thyromegaly, no lymphadenopathy,  supraclavicular cervical.  CARDIOVASCULAR: Regular rate and rhythm, no murmurs, rubs, or gallops.  Normal PMI, 2+ peripheral pulses, no peripheral edema.  LUNGS: Clear to auscultation bilaterally, no rales, rhonchi, or wheezes.  ABDOMEN: Soft, nontender, normal active bowel sounds, no hepatomegaly or  splenomegaly.  MUSCULOSKELETAL: Strength 5 out of 5 in the upper and lower extremities.  NEUROLOGIC: Cranial nerves 2-12 grossly intact, reflexes 2+ patellar.  Sensation intact in upper and lower extremities.  SKIN: Hypopigmented, macular rash across chest and upper abdomen. No  scale. No erythema.   ASSESSMENT AND PLAN:  1. Concern for diabetes: We will check a fasting blood sugar. She was      reassured though that her blood sugar that day was not  abnormal      given the fact that she was postprandial.  2. Pityriasis versicolor: She will try Ketoconazole 2% cream applied      to the affected area twice daily for 2 weeks. She will let me know      if this does not improve her symptoms.  3. Prevention: She is due for mammogram which was scheduled, as well      as scheduled for a pap smear. She is up-to-date with colonoscopy at      this point in time. She will look into when her last tetanus was      done and I will obtain records from her previous doctor. She is due      for cholesterol and at the same time a fasting blood sugar is      checked we can do this. She was encouraged to get regular exercise      and continue to healthily. I always encouraged her to take calcium      and vitamin D to prevent osteoporosis as she gets older.     Terri Nora, MD  Electronically Signed    AB/MedQ  DD: 05/17/2006  DT: 05/18/2006  Job #: [id number]

## 2010-09-22 NOTE — Op Note (Signed)
NAME:  [name], [name]                 ACCOUNT NO.:  [id number]   MEDICAL RECORD NO.:  [id number]          PATIENT TYPE:  AMB   LOCATION:  ENDO                         FACILITY:  [facility]   PHYSICIAN:  James L. Malon Kindle., M.D.DATE OF BIRTH:  Mar 09, 1961   DATE OF PROCEDURE:  02/09/2005  DATE OF DISCHARGE:                                 OPERATIVE REPORT   PROCEDURE:  Colonoscopy and polypectomy.   MEDICATIONS:  Fentanyl 100 mcg, Versed 10 mg IV, Benadryl 25 mg IV.   INSTRUMENT USED:  Pediatric adjustable colonoscope.   INDICATIONS FOR PROCEDURE:  Rectal bleeding.   DESCRIPTION OF PROCEDURE:  The procedure had been explained to the patient  and consent obtained.  In the left lateral decubitus position, the scope was  inserted and advanced.  The prep was excellent.  We were able to reach the  cecum with minimal difficulty.  The ileocecal valve and appendiceal orifice  was seen, the scope was withdrawn, and the mucosa carefully examined.  On  withdrawal, the ascending colon, transverse colon, descending and sigmoid  colon were seen well.  No diverticulosis, no polyps.  In the mid rectum at  approximately 15 cm from the anal verge, a 1 cm polyp was removed by snare  polypectomy.  This was recovered.  There was no bleeding.  Some internal  hemorrhoids were seen in the rectum upon removal.  The scope was withdrawn.  The patient tolerated the procedure well.   ASSESSMENT:  1.  Rectal polyp, removed, 211.4.  2.  Internal hemorrhoids, 455.0.   PLAN:  Routine post polypectomy instructions.  Will check pathology and will  repeat procedure in five years.           ______________________________  Llana Aliment Malon Kindle., M.D.     Waldron Session  D:  02/09/2005  T:  02/09/2005  Job:  [id number]   cc:   Dale Sumner  Fax: [phone]

## 2010-09-29 ENCOUNTER — Telehealth: Payer: Self-pay | Admitting: Family Medicine

## 2010-09-29 ENCOUNTER — Other Ambulatory Visit (INDEPENDENT_AMBULATORY_CARE_PROVIDER_SITE_OTHER): Payer: 59 | Admitting: Family Medicine

## 2010-09-29 DIAGNOSIS — Z13220 Encounter for screening for lipoid disorders: Secondary | ICD-10-CM

## 2010-09-29 DIAGNOSIS — E559 Vitamin D deficiency, unspecified: Secondary | ICD-10-CM

## 2010-09-29 LAB — COMPREHENSIVE METABOLIC PANEL
ALT: 13 U/L (ref 0–35)
AST: 14 U/L (ref 0–37)
Albumin: 3.7 g/dL (ref 3.5–5.2)
Alkaline Phosphatase: 56 U/L (ref 39–117)
Potassium: 3.9 mEq/L (ref 3.5–5.1)
Sodium: 137 mEq/L (ref 135–145)
Total Bilirubin: 0.3 mg/dL (ref 0.3–1.2)
Total Protein: 6.1 g/dL (ref 6.0–8.3)

## 2010-09-29 LAB — LIPID PANEL
HDL: 56 mg/dL (ref >39.00)
LDL Cholesterol: 99 mg/dL (ref 0–99)
Total CHOL/HDL Ratio: 3
VLDL: 19 mg/dL (ref 0.0–40.0)

## 2010-09-29 NOTE — Telephone Encounter (Signed)
Message copied by Excell Seltzer on Fri Sep 29, 2010  8:08 AM ------      Message from: Liane Comber C      Created: Wed Sep 27, 2010  7:24 AM      Regarding: Cpx labs fri       Please order  future cpx labs for pt's upcomming lab appt.      Thanks      Rodney Booze

## 2010-09-30 ENCOUNTER — Encounter: Payer: Self-pay | Admitting: Family Medicine

## 2010-09-30 LAB — VITAMIN D 25 HYDROXY (VIT D DEFICIENCY, FRACTURES): Vit D, 25-Hydroxy: 29 ng/mL — ABNORMAL LOW (ref 30–89)

## 2010-10-06 ENCOUNTER — Ambulatory Visit (INDEPENDENT_AMBULATORY_CARE_PROVIDER_SITE_OTHER): Payer: 59 | Admitting: Family Medicine

## 2010-10-06 ENCOUNTER — Other Ambulatory Visit (HOSPITAL_COMMUNITY)
Admission: RE | Admit: 2010-10-06 | Discharge: 2010-10-06 | Disposition: A | Discharge status: Home / Self Care | Payer: 59 | Source: Ambulatory Visit | Attending: Family Medicine | Admitting: Family Medicine

## 2010-10-06 ENCOUNTER — Encounter: Payer: Self-pay | Admitting: Family Medicine

## 2010-10-06 VITALS — BP 100/60 | HR 94 | Temp 98.3°F | Ht 67.0 in | Wt 148.0 lb

## 2010-10-06 DIAGNOSIS — Z01419 Encounter for gynecological examination (general) (routine) without abnormal findings: Secondary | ICD-10-CM | POA: Insufficient documentation

## 2010-10-06 DIAGNOSIS — D259 Leiomyoma of uterus, unspecified: Secondary | ICD-10-CM

## 2010-10-06 DIAGNOSIS — Z1159 Encounter for screening for other viral diseases: Secondary | ICD-10-CM | POA: Insufficient documentation

## 2010-10-06 NOTE — Assessment & Plan Note (Addendum)
Asymptomatic. Last US showed 4 cm in 2009, today it feels more like 4 inches. Recommend further eval with [place].

## 2010-10-06 NOTE — Patient Instructions (Addendum)
Calcium 600 mg and vit D3 400 IU once or twice a day. Call about mammogram to schedule.  Continue work on exercise and diet.

## 2010-10-06 NOTE — Progress Notes (Signed)
Subjective:    Patient ID: [name], female    DOB: 1961/01/04, 50 y.o.   MRN: [id number]  HPI The patient is here for annual wellness exam and preventative care.      Review of Systems  Constitutional: Negative for fever, fatigue and unexpected weight change.  HENT: Negative for ear pain, congestion, sore throat, sneezing, trouble swallowing and sinus pressure.   Eyes: Negative for pain and itching.  Respiratory: Negative for cough, shortness of breath and wheezing.   Cardiovascular: Negative for chest pain, palpitations and leg swelling.  Gastrointestinal: Negative for nausea, abdominal pain, diarrhea, constipation and blood in stool.  Genitourinary: Negative for dysuria, hematuria, vaginal discharge, difficulty urinating and menstrual problem.  Skin: Negative for rash.  Neurological: Negative for syncope, weakness, light-headedness, numbness and headaches.  Psychiatric/Behavioral: Negative for confusion and dysphoric mood. The patient is not nervous/anxious.        Objective:   Physical Exam  Constitutional: Vital signs are normal. She appears well-developed and well-nourished. She is cooperative.  Non-toxic appearance. She does not appear ill. No distress.  HENT:  Head: Normocephalic.  Right Ear: Hearing, tympanic membrane, external ear and ear canal normal.  Left Ear: Hearing, tympanic membrane, external ear and ear canal normal.  Nose: Nose normal.  Eyes: Conjunctivae, EOM and lids are normal. Pupils are equal, round, and reactive to light. No foreign bodies found.  Neck: Trachea normal and normal range of motion. Neck supple. Carotid bruit is not present. No mass and no thyromegaly present.  Cardiovascular: Normal rate, regular rhythm, S1 normal, S2 normal, normal heart sounds and intact distal pulses.  Exam reveals no gallop.   No murmur heard. Pulmonary/Chest: Effort normal and breath sounds normal. No respiratory distress. She has no wheezes. She has no rhonchi. She has  no rales.  Abdominal: Soft. Normal appearance and bowel sounds are normal. She exhibits no distension, no fluid wave, no abdominal bruit and no mass. There is no hepatosplenomegaly. There is no tenderness. There is no rebound, no guarding and no CVA tenderness. No hernia.  Genitourinary: Vagina normal. No breast swelling, tenderness, discharge or bleeding. Pelvic exam was performed with patient prone. There is no rash, tenderness or lesion on the right labia. There is no rash, tenderness or lesion on the left labia. Uterus is enlarged. Uterus is not tender. Cervix exhibits no motion tenderness, no discharge and no friability. Right adnexum displays no mass, no tenderness and no fullness. Left adnexum displays no mass, no tenderness and no fullness.       Fibroid palpated in low abdomen, stable in size from last exam.   Lymphadenopathy:    She has no cervical adenopathy.    She has no axillary adenopathy.  Neurological: She is alert. She has normal strength. No cranial nerve deficit or sensory deficit.  Skin: Skin is warm, dry and intact. No rash noted.  Psychiatric: Her speech is normal and behavior is normal. Judgment normal. Her mood appears not anxious. Cognition and memory are normal. She does not exhibit a depressed mood.          Assessment & Plan  Complete Physcial Exam: The patient's preventative maintenance and recommended screening tests for an annual wellness exam were reviewed in full today. Brought up to date unless services declined.  Counselled on the importance of diet, exercise, and its role in overall health and mortality. The patient's FH and SH was reviewed, including their home life, tobacco status, and drug and alcohol status.   COlonoscopy  done in 2011 nml.. Not due until 2021.

## 2010-10-09 NOTE — Progress Notes (Signed)
Addended by: Kerby Nora E on: 10/09/2010 10:27 AM   Modules accepted: Orders

## 2010-10-13 ENCOUNTER — Ambulatory Visit (HOSPITAL_COMMUNITY)
Admission: RE | Admit: 2010-10-13 | Discharge: 2010-10-13 | Disposition: A | Discharge status: Home / Self Care | Payer: 59 | Source: Ambulatory Visit | Attending: Family Medicine | Admitting: Family Medicine

## 2010-10-13 ENCOUNTER — Encounter: Payer: Self-pay | Admitting: *Deleted

## 2010-10-13 DIAGNOSIS — D259 Leiomyoma of uterus, unspecified: Secondary | ICD-10-CM

## 2011-04-30 ENCOUNTER — Other Ambulatory Visit: Payer: Self-pay | Admitting: *Deleted

## 2011-04-30 ENCOUNTER — Other Ambulatory Visit: Payer: Self-pay | Admitting: Internal Medicine

## 2011-04-30 DIAGNOSIS — J329 Chronic sinusitis, unspecified: Secondary | ICD-10-CM

## 2011-04-30 MED ORDER — AZITHROMYCIN 250 MG PO TABS: ORAL_TABLET | ORAL | Status: AC

## 2011-04-30 MED ORDER — AZITHROMYCIN 250 MG PO TABS: ORAL_TABLET | ORAL | Status: DC

## 2011-12-06 ENCOUNTER — Ambulatory Visit (INDEPENDENT_AMBULATORY_CARE_PROVIDER_SITE_OTHER): Payer: 59 | Admitting: Family Medicine

## 2011-12-06 ENCOUNTER — Encounter: Payer: Self-pay | Admitting: Family Medicine

## 2011-12-06 VITALS — BP 100/64 | HR 80 | Temp 98.7°F | Wt 153.8 lb

## 2011-12-06 DIAGNOSIS — R1031 Right lower quadrant pain: Secondary | ICD-10-CM | POA: Insufficient documentation

## 2011-12-06 NOTE — Assessment & Plan Note (Signed)
No sign of DVT. No sign of hip source of pain. Most likely due to MSK strain of adductor muscles. Treat with ibuprofen prn and gentle stretching exercises. Call if not improving as expected.

## 2011-12-06 NOTE — Progress Notes (Signed)
  Subjective:    Patient ID: Marybeth Dandy, female    DOB: February 17, 1961, 51 y.o.   MRN: [id number]  HPI 51 year old female presents with 3-4 months of intermittant pain in right groin. Pain last several days at a time. Nothing makes it worse, nothing makes it better. Full ROM of right hip.  no low back pain. No fever.  No abdominal pain.  No leg swellling or varicose veins.  Feels well otherwise.  No known injury.  No recent travel.      Review of Systems  Constitutional: Negative for fever and fatigue.  HENT: Negative for ear pain.   Eyes: Negative for pain.  Respiratory: Negative for chest tightness and shortness of breath.   Cardiovascular: Negative for chest pain, palpitations and leg swelling.  Gastrointestinal: Negative for abdominal pain.  Genitourinary: Negative for dysuria.       Objective:   Physical Exam  Constitutional: She appears well-developed and well-nourished.  Neck: Normal range of motion. Neck supple.  Cardiovascular: Normal rate and regular rhythm.   Pulmonary/Chest: Effort normal and breath sounds normal.  Abdominal: Soft. Bowel sounds are normal. She exhibits no distension and no mass. There is no tenderness. There is no rebound and no guarding.  Musculoskeletal:       Full ROM B hips, ttp over right anterior thigh.. Near adductor muscles, neg Faber's          Assessment & Plan:

## 2011-12-06 NOTE — Patient Instructions (Signed)
Ibuprofen 800 mg TID x few days then prn. Review stretching info given. Do 2-3 times daily. Call if not improivng as expected.

## 2012-03-18 ENCOUNTER — Other Ambulatory Visit: Payer: Self-pay | Admitting: Family Medicine

## 2012-03-18 DIAGNOSIS — Z1231 Encounter for screening mammogram for malignant neoplasm of breast: Secondary | ICD-10-CM

## 2012-03-21 ENCOUNTER — Ambulatory Visit (HOSPITAL_COMMUNITY)
Admission: RE | Admit: 2012-03-21 | Discharge: 2012-03-21 | Disposition: A | Discharge status: Home / Self Care | Payer: 59 | Source: Ambulatory Visit | Attending: Family Medicine | Admitting: Family Medicine

## 2012-03-21 DIAGNOSIS — Z1231 Encounter for screening mammogram for malignant neoplasm of breast: Secondary | ICD-10-CM | POA: Insufficient documentation

## 2012-03-27 ENCOUNTER — Encounter: Payer: Self-pay | Admitting: Family Medicine

## 2012-03-27 ENCOUNTER — Ambulatory Visit (INDEPENDENT_AMBULATORY_CARE_PROVIDER_SITE_OTHER): Payer: 59 | Admitting: Family Medicine

## 2012-03-27 VITALS — BP 100/68 | HR 96 | Temp 98.3°F | Ht 67.0 in

## 2012-03-27 DIAGNOSIS — N644 Mastodynia: Secondary | ICD-10-CM

## 2012-03-27 NOTE — Progress Notes (Signed)
  Subjective:    Patient ID: [name], female    DOB: 1960-06-15, 51 y.o.   MRN: [id number]  HPI   51 year old female with left breast in last 2 weeks under nipple. No mass, no nipple discharge. No fever.  No known injury.  Caffeine: 20 oz pepsi daily.   11/12 nml mammo, no US done.   Last menses was 2-3 weeks ago.   Breast cancer..  In aunt and father aunt   Review of Systems  Constitutional: Negative for fever and fatigue.  HENT: Negative for ear pain.   Eyes: Negative for pain.  Respiratory: Negative for chest tightness and shortness of breath.   Cardiovascular: Negative for chest pain, palpitations and leg swelling.  Gastrointestinal: Negative for abdominal pain.  Genitourinary: Negative for dysuria.       Objective:   Physical Exam  Constitutional: Vital signs are normal. She appears well-developed and well-nourished. She is cooperative.  Non-toxic appearance. She does not appear ill. No distress.  HENT:  Head: Normocephalic.  Right Ear: Hearing, tympanic membrane, external ear and ear canal normal. Tympanic membrane is not erythematous, not retracted and not bulging.  Left Ear: Hearing, tympanic membrane, external ear and ear canal normal. Tympanic membrane is not erythematous, not retracted and not bulging.  Nose: No mucosal edema or rhinorrhea. Right sinus exhibits no maxillary sinus tenderness and no frontal sinus tenderness. Left sinus exhibits no maxillary sinus tenderness and no frontal sinus tenderness.  Mouth/Throat: Uvula is midline, oropharynx is clear and moist and mucous membranes are normal.  Eyes: Conjunctivae normal, EOM and lids are normal. Pupils are equal, round, and reactive to light. No foreign bodies found.  Neck: Trachea normal and normal range of motion. Neck supple. Carotid bruit is not present. No mass and no thyromegaly present.  Cardiovascular: Normal rate, regular rhythm, S1 normal, S2 normal, normal heart sounds, intact distal pulses and  normal pulses.  Exam reveals no gallop and no friction rub.   No murmur heard. Pulmonary/Chest: Effort normal and breath sounds normal. Not tachypneic. No respiratory distress. She has no decreased breath sounds. She has no wheezes. She has no rhonchi. She has no rales.  Abdominal: Soft. Normal appearance and bowel sounds are normal. There is no tenderness.  Genitourinary: There is breast tenderness. No breast swelling, discharge or bleeding. Pelvic exam was performed with patient supine.       onleft.  Neurological: She is alert.  Skin: Skin is warm, dry and intact. No rash noted.  Psychiatric: Her speech is normal and behavior is normal. Judgment and thought content normal. Her mood appears not anxious. Cognition and memory are normal. She does not exhibit a depressed mood.          Assessment & Plan:

## 2012-03-27 NOTE — Patient Instructions (Addendum)
Stop all caffeine gradually.  Can use tylenol or ibuprofen for pain.  Follow up in 1 month. Call sooner if new symtpoms.

## 2012-04-22 DIAGNOSIS — N644 Mastodynia: Secondary | ICD-10-CM | POA: Insufficient documentation

## 2012-04-22 NOTE — Assessment & Plan Note (Addendum)
Recent normal mammogram. Likely due to caffeine or other irritants. Stop all caffeine and give more time. If not improving in next  Month, call for re-eval.

## 2012-04-23 ENCOUNTER — Emergency Department (HOSPITAL_COMMUNITY): Payer: 59

## 2012-04-23 ENCOUNTER — Encounter: Payer: Self-pay | Admitting: Family Medicine

## 2012-04-23 ENCOUNTER — Observation Stay (HOSPITAL_COMMUNITY)
Admission: EM | Admit: 2012-04-23 | Discharge: 2012-04-24 | Disposition: A | Discharge status: Home / Self Care | Payer: 59 | Attending: Emergency Medicine | Admitting: Emergency Medicine

## 2012-04-23 ENCOUNTER — Encounter (HOSPITAL_COMMUNITY): Payer: Self-pay | Admitting: *Deleted

## 2012-04-23 ENCOUNTER — Ambulatory Visit (INDEPENDENT_AMBULATORY_CARE_PROVIDER_SITE_OTHER): Payer: 59 | Admitting: Family Medicine

## 2012-04-23 VITALS — BP 120/80 | HR 72 | Temp 98.1°F | Wt 150.0 lb

## 2012-04-23 DIAGNOSIS — R05 Cough: Secondary | ICD-10-CM | POA: Insufficient documentation

## 2012-04-23 DIAGNOSIS — R079 Chest pain, unspecified: Secondary | ICD-10-CM

## 2012-04-23 DIAGNOSIS — R5381 Other malaise: Secondary | ICD-10-CM | POA: Insufficient documentation

## 2012-04-23 DIAGNOSIS — R059 Cough, unspecified: Secondary | ICD-10-CM | POA: Insufficient documentation

## 2012-04-23 DIAGNOSIS — R42 Dizziness and giddiness: Secondary | ICD-10-CM | POA: Insufficient documentation

## 2012-04-23 DIAGNOSIS — K219 Gastro-esophageal reflux disease without esophagitis: Secondary | ICD-10-CM | POA: Insufficient documentation

## 2012-04-23 LAB — TROPONIN I
Troponin I: <0.3 ng/mL (ref <0.30)
Troponin I: <0.3 ng/mL (ref <0.30)

## 2012-04-23 LAB — COMPREHENSIVE METABOLIC PANEL
Albumin: 4 g/dL (ref 3.5–5.2)
BUN: 7 mg/dL (ref 6–23)
Creatinine, Ser: 0.78 mg/dL (ref 0.50–1.10)
Total Protein: 6.9 g/dL (ref 6.0–8.3)

## 2012-04-23 LAB — CBC WITH DIFFERENTIAL/PLATELET
Basophils Relative: 1 % (ref 0–1)
Eosinophils Absolute: 0.1 10*3/uL (ref 0.0–0.7)
Hemoglobin: 14.3 g/dL (ref 12.0–15.0)
MCH: 30.6 pg (ref 26.0–34.0)
MCHC: 34.3 g/dL (ref 30.0–36.0)
Monocytes Absolute: 0.3 10*3/uL (ref 0.1–1.0)
Monocytes Relative: 6 % (ref 3–12)
Neutrophils Relative %: 65 % (ref 43–77)

## 2012-04-23 LAB — D-DIMER, QUANTITATIVE: D-Dimer, Quant: 1.01 ug/mL-FEU — ABNORMAL HIGH (ref 0.00–0.48)

## 2012-04-23 MED ORDER — NITROGLYCERIN 0.4 MG SL SUBL
0.4000 mg | SUBLINGUAL_TABLET | SUBLINGUAL | Status: DC | PRN
  Administered 2012-04-23: 0.4 mg via SUBLINGUAL
  Filled 2012-04-23 (×2): qty 25

## 2012-04-23 MED ORDER — MORPHINE SULFATE 4 MG/ML IJ SOLN
4.0000 mg | Freq: Once | INTRAMUSCULAR | Status: AC
  Administered 2012-04-23: 4 mg via INTRAVENOUS
  Filled 2012-04-23: qty 1

## 2012-04-23 MED ORDER — IOHEXOL 350 MG/ML SOLN
100.0000 mL | Freq: Once | INTRAVENOUS | Status: AC | PRN
  Administered 2012-04-23: 100 mL via INTRAVENOUS

## 2012-04-23 MED ORDER — ONDANSETRON HCL 4 MG/2ML IJ SOLN
4.0000 mg | Freq: Once | INTRAMUSCULAR | Status: AC
  Administered 2012-04-23: 4 mg via INTRAVENOUS
  Filled 2012-04-23: qty 2

## 2012-04-23 MED ORDER — SODIUM CHLORIDE 0.9 % IV BOLUS (SEPSIS)
1000.0000 mL | Freq: Once | INTRAVENOUS | Status: AC
  Administered 2012-04-23: 1000 mL via INTRAVENOUS

## 2012-04-23 NOTE — ED Notes (Addendum)
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  Rates as 4/10 at present.  Had 1 NTG enroute with no change, Zofran 4 mg IV for nausea and 324 of ASA.

## 2012-04-23 NOTE — ED Provider Notes (Signed)
51 year old female had onset 2 days ago of mid chest discomfort which started after eating lunch. It has waxed and waned since then but has never gone away completely. There is some mild associated dyspnea, but no nausea diaphoresis. On exam, lungs are clear and heart has regular rate and rhythm. Initial workup has come back unremarkable except for elevated d-dimer. She has no risk factors for pulmonary embolism, but she will be sent for CT angiogram.  CT angiogram is unremarkable. She will be kept in CDU under the chest pain protocol.   Date: 04/23/2012  Rate: 119  Rhythm: sinus tachycardia  QRS Axis: normal  Intervals: normal  ST/T Wave abnormalities: nonspecific T wave changes  Conduction Disutrbances:none  Narrative Interpretation: Sinus tachycardia, probable right ventricular hypertrophy, minor nonspecific T wave flattening. No prior ECG available for comparison.  Old EKG Reviewed: none available   Date: 04/23/2012  2041  Rate: 89  Rhythm: normal sinus rhythm  QRS Axis: normal  Intervals: normal  ST/T Wave abnormalities: nonspecific T wave changes  Conduction Disutrbances:none  Narrative Interpretation: Minor nonspecific T wave flattening. When compared with ECG of earlier today, heart rate is decreased, otherwise, no significant changes are seen.`  Old EKG Reviewed: unchanged    I saw and evaluated the patient, reviewed the resident's note and I agree with the findings and plan.     Dione Booze, MD 04/24/12 [phone]

## 2012-04-23 NOTE — ED Notes (Signed)
Pt undressed, in gown, on monitor, continuous pulse oximetry and blood pressure cuff; EKG performed; family at bedside 

## 2012-04-23 NOTE — Progress Notes (Signed)
Nature conservation officer at [facility] [address] [place] [address] Phone: [phone] Fax: [phone]  Date:  04/23/2012   Name:  [name]   DOB:  1961/04/29   MRN:  [id number] Gender: female Age: 51 y.o.  PCP:  Kerby Nora, MD  Evaluating MD: Hannah Beat, MD   Chief Complaint: Mid chest pain since monday afternoon   History of Present Illness:  [name] is a 51 y.o. pleasant patient who presents with the following:  Generally healthy 51 yo with no cardiac risk factors presents with worsening 48 hour history of mid sternal chest pain.  Chest hurts and feels tired. Thought maybe reflux, and Monday and Tuesday, and it is still there: took PPI and H2 blocker, no help.  Also felt weak.  Cough, but only minimal.  RISK: No HTN or lipids Smoker 25 years ago No FH of CAD No drugs.   No h/o CAD or CVA  Patient Active Problem List  Diagnosis  . ALLERGIC RHINITIS  . ABNORMAL PAP SMEAR  . GASTROINTESTINAL HEMORRHAGE, HX OF  . Uterine fibroid  . Right groin pain  . Breast pain    Past Medical History  Diagnosis Date  . Hemorrhage     gastrointestinal, associated with GI infection  . Abnormal glandular Papanicolaou smear of cervix   . NSVD (normal spontaneous vaginal delivery)       X 3  . History of cervical biopsy 1986    hemorrhage, negative biopsy    No past surgical history on file.  History  Substance Use Topics  . Smoking status: Former Games developer  . Smokeless tobacco: Not on file  . Alcohol Use: No    Family History  Problem Relation Age of Onset  . Hyperlipidemia Father   . Diabetes Maternal Aunt   . Cancer Paternal Grandfather     lung  . Heart disease Neg Hx     no MI's less than 55  . Cancer Maternal Aunt     breast, grandmother's sister  . Cancer Maternal Aunt     colon    Allergies  Allergen Reactions  . Cefuroxime Axetil     Medication list has been reviewed and updated.  No outpatient prescriptions prior to  visit.    Last reviewed on 04/23/2012 12:40 PM by Eliezer Bottom, CMA  Review of Systems:  Chest pain No fever Tingling - legs now Given GI cocktail, "maybe a little better" Worsened while sitting in the office --- "I really don't feel good" No neuro signs Otherwise, the pertinent positives and negatives are listed above and in the HPI, otherwise a full review of systems has been reviewed and is negative unless noted positive.   Physical Examination: Filed Vitals:   04/23/12 1235  BP: 120/80  Pulse: 72  Temp: 98.1 F (36.7 C)  Weight: 150 lb (68.04 kg)    There is no height on file to calculate BMI. Ideal Body Weight:     GEN: WDWN, NAD, Non-toxic, A & O x 3 HEENT: Atraumatic, Normocephalic. Neck supple. No masses, No LAD. Ears and Nose: No external deformity. CV: RRR, No M/G/R. No JVD. No thrill. No extra heart sounds. PULM: CTA B, no wheezes, crackles, rhonchi. No retractions. No resp. distress. No accessory muscle use. EXTR: No c/c/e NEURO Normal gait.  PSYCH: Normally interactive. Conversant. Not depressed or anxious appearing.  Calm demeanor.    Assessment and Plan:  1. Chest pain  EKG 12-Lead   EKG: NSR,  inverted T waves v1-v5, flattened at v6, flattened all other leads. No q, no ST elevation or depression  Spoke to [facility] cards master - will see in ER  GI cocktail, no help Given 81 mg ASA x 2 in office and [place] O2  Concern for potential ischemia or unstable angina  I also discussed the case with my partner Dr. Sharen Hones who agreed.  Orders Today:  Orders Placed This Encounter  Procedures  . EKG 12-Lead    Updated Medication List: (Includes new medications, updates to list, dose adjustments) No orders of the defined types were placed in this encounter.    Medications Discontinued: There are no discontinued medications.   Hannah Beat, MD

## 2012-04-23 NOTE — ED Provider Notes (Signed)
History     CSN: [id number]  Arrival date & time 04/23/12  1407   First MD Initiated Contact with Patient 04/23/12 1502     Chief Complaint  Patient presents with  . Chest Pain   HPI: [name] is a 51 yo CF with history of GERD who presents with substernal chest pain. Pain started two days ago while she working. Pain started after lunch and she initially thought it was indigestion, she took an antiacid with some relief of symptoms. However, the pain has persisted. Pain is described as pressure, non-radiating, severity varies, currently 1/10, no exacerbating factors. Not associated with SOB, diaphoresis, nausea, vomiting, or syncope. Pain not pleuritic. Associated with generalized fatigue, worse in her legs. Further she denies fever or chills. She had symptoms similar to this previously several months ago, but it resolved spontaneously and she did not seek care. She was at work today with persistent pain, given ASA and one dose of nitro with only mild relief of symptoms. Transported to our ED via EMS.   Past Medical History  Diagnosis Date  . Hemorrhage     gastrointestinal, associated with GI infection  . Abnormal glandular Papanicolaou smear of cervix   . NSVD (normal spontaneous vaginal delivery)       X 3  . History of cervical biopsy 1986    hemorrhage, negative biopsy    History reviewed. No pertinent past surgical history.  Family History  Problem Relation Age of Onset  . Hyperlipidemia Father   . Diabetes Maternal Aunt   . Cancer Paternal Grandfather     lung  . Heart disease Neg Hx     no MI's less than 55  . Cancer Maternal Aunt     breast, grandmother's sister  . Cancer Maternal Aunt     colon    History  Substance Use Topics  . Smoking status: Former Games developer  . Smokeless tobacco: Not on file  . Alcohol Use: No    OB History    Grav Para Term Preterm Abortions TAB SAB Ect Mult Living                  Review of Systems  Constitutional: Positive for  fatigue. Negative for fever, chills and appetite change.  HENT: Negative for congestion, rhinorrhea, trouble swallowing, neck pain and neck stiffness.   Eyes: Negative for photophobia and visual disturbance.  Respiratory: Positive for cough (non-productive cough). Negative for shortness of breath and wheezing.   Cardiovascular: Positive for chest pain. Negative for palpitations and leg swelling.  Gastrointestinal: Negative for nausea, vomiting and abdominal pain.  Genitourinary: Negative for dysuria, decreased urine volume, vaginal bleeding and difficulty urinating.  Musculoskeletal: Negative for myalgias, back pain and arthralgias.  Skin: Negative for pallor and rash.  Neurological: Positive for light-headedness. Negative for dizziness, seizures, syncope and headaches.  Psychiatric/Behavioral: Negative for confusion and agitation.  All other systems reviewed and are negative.    Allergies  Cefuroxime axetil  Home Medications   Current Outpatient Rx  Name  Route  Sig  Dispense  Refill  . CETIRIZINE HCL 10 MG PO TABS   Oral   Take 10 mg by mouth daily as needed. For congestion           BP 134/81  Pulse 118  Resp 20  Ht 5\' 7"  (1.702 m)  Wt 150 lb (68.04 kg)  BMI 23.49 kg/m2  SpO2 99%  Physical Exam  Nursing note and vitals reviewed. Constitutional: She is  oriented to person, place, and time. She appears well-developed and well-nourished. She is cooperative. No distress.  HENT:  Head: Normocephalic and atraumatic.  Mouth/Throat: Oropharynx is clear and moist and mucous membranes are normal.  Eyes: Conjunctivae normal and EOM are normal. Pupils are equal, round, and reactive to light.  Neck: Trachea normal and normal range of motion. Neck supple.  Cardiovascular: Regular rhythm, S1 normal, S2 normal and normal heart sounds.  Tachycardia present.  Exam reveals no decreased pulses.   Pulmonary/Chest: Effort normal and breath sounds normal. She has no decreased breath sounds.  She has no rales.  Abdominal: Soft. Normal appearance and bowel sounds are normal. There is no tenderness. There is no CVA tenderness.  Musculoskeletal: Normal range of motion. She exhibits no edema.  Neurological: She is alert and oriented to person, place, and time.  Skin: Skin is warm and dry. No rash noted.   ED Course  Procedures   Labs Reviewed  COMPREHENSIVE METABOLIC PANEL - Abnormal; Notable for the following:    Glucose, Bld 102 (*)     Total Bilirubin 0.2 (*)     All other components within normal limits  D-DIMER, QUANTITATIVE - Abnormal; Notable for the following:    D-Dimer, Quant 1.01 (*)     All other components within normal limits  CBC WITH DIFFERENTIAL  TROPONIN I  TROPONIN I   Dg Chest 2 View  04/23/2012  *RADIOLOGY REPORT*  Clinical Data: Substernal chest pain.  CHEST - 2 VIEW  Comparison: None.  Findings: The heart size is normal.  The lungs are clear.  The visualized soft tissues and bony thorax are unremarkable.  IMPRESSION: Negative two-view chest.   Original Report Authenticated By: Marin Roberts, M.D.    Ct Angio Chest Pe W/cm &/or Wo Cm  04/23/2012  *RADIOLOGY REPORT*  Clinical Data: Chest pain with elevated D-dimer  CT ANGIOGRAPHY CHEST  Technique:  Multidetector CT imaging of the chest using the standard protocol during bolus administration of intravenous contrast. Multiplanar reconstructed images including MIPs were obtained and reviewed to evaluate the vascular anatomy.  Contrast: OMNIPAQUE IOHEXOL 350 MG/ML SOLN  Comparison: Chest x-ray 12/17/1811  Findings: Negative for pulmonary embolism.  Aortic arch is normal. Heart is mildly enlarged without pericardial effusion or coronary artery calcification.  The lungs are clear without infiltrate or effusion.  No mass or adenopathy.  IMPRESSION: Negative for pulmonary embolism.  No acute abnormality.   Original Report Authenticated By: Janeece Riggers, M.D.    MDM  51 yo CF with history of GERD who  presents with substernal chest pain. Afebrile, tachycardic to 110, normotensive. Concern for PE with CP and tachycardia, low risk by [facility] but not able to [facility] due to tachycardia. D-dimer elevated at 1.1, obtained CTA chest which was negative for PE or other acute pathology. ECG with non-specific findings without acute ischemia. Troponin X 2 negative. Lytes and cr normal. WBC 5.8, Hgb 14.3. Doubt PNA as no cough, normal CXR. Due to persistent CP will place in chest pain protocol overnight for stress in morning. Patient free with morphine and nitro. Repeat ECG without significant change. ASA given by EMS prior to arrival. Patient in agreement with plan to stay  ECG: ST, rate 119, normal axis, flattened T waves inferolateral leads. No significant STE or depression. No previous ECG for comparison.   Reviewed imaging, labs and previous medical records, utilized in MDM  Discussed case with Dr. Preston Fleeting  Clinical Impression 1. Chest pain  Margie Billet, MD 04/24/12 0030

## 2012-04-23 NOTE — ED Notes (Signed)
Patient awaiting CT - Remains on ED monitors, husband at bedside.

## 2012-04-24 MED ORDER — METOPROLOL TARTRATE 25 MG PO TABS
100.0000 mg | ORAL_TABLET | Freq: Once | ORAL | Status: AC
  Administered 2012-04-24: 100 mg via ORAL

## 2012-04-24 MED ORDER — METOPROLOL TARTRATE 25 MG PO TABS
50.0000 mg | ORAL_TABLET | Freq: Once | ORAL | Status: DC
  Filled 2012-04-24 (×2): qty 2

## 2012-04-24 NOTE — ED Notes (Signed)
Pt resting/ sleeping, no changes.

## 2012-04-24 NOTE — Consult Note (Signed)
Admit date: 04/23/2012 Referring Physician  [place] Dr. Saralyn Pilar Primary Physician  Amy Ermalene Searing, MD Primary Cardiologist  None Reason for Consultation  Chest pain  ASSESSMENT: 1. 48 hours or chest pain atypical for MI and ruled out by ECG and markers  2. Elevated D-dimer and sinus tachycardia with no evidence of PE by CT scan  3. Gastroesophageal reflux   PLAN:  1. Patient received beta blocker therapy and will likely not get HR response needed for stress test today. Given negative workup, I think it would be safe to discharge home and have ETT performed in next several days as an outpatient. She is a Adult nurse primary patient and would like to have the study done within the [employer]. Spoke to [facility], [place] who will arrange.  2. If recurrent severe symptoms, she should return to the ER  3. Contact the PCP today as GI workup may be needed as well.   HPI: 48 hour history of continuous chest pressure starting around noon on Monday. There was no dyspnea and there was no influence on severity by exertion or position. No pleuritic component was notes and no chills or fever. Prevacid on Monday seemed to lessen the intensity. She is pain free at this time.   On presentation she had sinus tach at 118 bpm and d-dimer was elevated. CT angio of the chest was negative for PE and there was no pericardial effusion, coronary calcification or aortic enlargement.   PMH:   Past Medical History  Diagnosis Date  . Hemorrhage     gastrointestinal, associated with GI infection  . Abnormal glandular Papanicolaou smear of cervix   . NSVD (normal spontaneous vaginal delivery)       X 3  . History of cervical biopsy 1986    hemorrhage, negative biopsy     PSH:  History reviewed. No pertinent past surgical history.  Allergies:  Cefuroxime axetil Prior to Admit Meds:   (Not in a hospital admission) Fam HX:    Family History  Problem Relation Age of Onset  . Hyperlipidemia Father   .  Diabetes Maternal Aunt   . Cancer Paternal Grandfather     lung  . Heart disease Neg Hx     no MI's less than 55  . Cancer Maternal Aunt     breast, grandmother's sister  . Cancer Maternal Aunt     colon   Social HX:    History   Social History  . Marital Status: Married    Spouse Name: N/A    Number of Children: 3  . Years of Education: N/A   Occupational History  . medical receptionist    Social History Main Topics  . Smoking status: Former Games developer  . Smokeless tobacco: Not on file  . Alcohol Use: No  . Drug Use: No  . Sexually Active: Not on file   Other Topics Concern  . Not on file   Social History Narrative   Regular exercise at [employer], 3-5 days a week. Diet: fruits and veggies, grilled. Good water intake, only one pepsi a day, minimal calcium.     Review of Systems: Has intermittent indigestion relieved by Prevacid.  Physical Exam: Blood pressure 80/45, pulse 59, temperature 98.9 F (37.2 C), temperature source Oral, resp. rate 12, height 5\' 7"  (1.702 m), weight 68.04 kg (150 lb), last menstrual period 04/09/2012, SpO2 97.00%. Weight change:    Skin is clear and there is no cyanosis HEENT is negative Chest is clear  to auscultation and no rub or rales are heard. Cardiac reveals no murmur, rub, or gallop. Abdomen is soft Extremities reveal no edema and the pulses are symmetric No focal deficits are noted.  Labs:   Lab Results  Component Value Date   WBC 5.8 04/23/2012   HGB 14.3 04/23/2012   HCT 41.7 04/23/2012   MCV 89.1 04/23/2012   PLT 216 04/23/2012    Lab 04/23/12 1538  NA 138  K 3.9  CL 102  CO2 26  BUN 7  CREATININE 0.78  CALCIUM 9.5  PROT 6.9  BILITOT 0.2*  ALKPHOS 70  ALT 18  AST 17  GLUCOSE 102*   Lab Results  Component Value Date   TROPONINI <0.30 04/23/2012     Lab Results  Component Value Date   CHOL 174 09/29/2010   CHOL 206* 07/08/2008   CHOL 176 05/17/2006   Lab Results  Component Value Date   HDL 56.00 09/29/2010    HDL 58.7 07/08/2008   HDL 62.5 05/17/2006   Lab Results  Component Value Date   LDLCALC 99 09/29/2010   LDLCALC 104* 05/17/2006   Lab Results  Component Value Date   TRIG 95.0 09/29/2010   TRIG 88 07/08/2008   Lab Results  Component Value Date   CHOLHDL 3 09/29/2010   CHOLHDL 3.5 CALC 07/08/2008   Lab Results  Component Value Date   LDLDIRECT 125.7 07/08/2008      Radiology:  Dg Chest 2 View  04/23/2012  *RADIOLOGY REPORT*  Clinical Data: Substernal chest pain.  CHEST - 2 VIEW  Comparison: None.  Findings: The heart size is normal.  The lungs are clear.  The visualized soft tissues and bony thorax are unremarkable.  IMPRESSION: Negative two-view chest.   Original Report Authenticated By: Marin Roberts, M.D.    Ct Angio Chest Pe W/cm &/or Wo Cm  04/23/2012  *RADIOLOGY REPORT*  Clinical Data: Chest pain with elevated D-dimer  CT ANGIOGRAPHY CHEST  Technique:  Multidetector CT imaging of the chest using the standard protocol during bolus administration of intravenous contrast. Multiplanar reconstructed images including MIPs were obtained and reviewed to evaluate the vascular anatomy.  Contrast: OMNIPAQUE IOHEXOL 350 MG/ML SOLN  Comparison: Chest x-ray 12/17/1811  Findings: Negative for pulmonary embolism.  Aortic arch is normal. Heart is mildly enlarged without pericardial effusion or coronary artery calcification.  The lungs are clear without infiltrate or effusion.  No mass or adenopathy.  IMPRESSION: Negative for pulmonary embolism.  No acute abnormality.   Original Report Authenticated By: Janeece Riggers, M.D.    EKG:   Normal x 2 after resolution of sinus tachycardia on arrival.    Lesleigh Noe 04/24/2012 7:56 AM

## 2012-04-24 NOTE — ED Notes (Signed)
Pt resting/ sleeping, arousable, no changes.

## 2012-04-24 NOTE — ED Notes (Signed)
Pt alert, NAD, calm, interactive, skin W&D, resps e/u, speaking in clear complete sentences, moved from 33 to CDU 10 via stretcher, up to b/r on arrival to CDU, back to stretcher w/o change, incident or sx.  (denies:  pain, sob, nausea or other sx), family at [facility], given happy meal. VSS.

## 2012-04-24 NOTE — ED Notes (Signed)
Terri Sprang MD [name] at bedside and has spoken to MD at [place].

## 2012-04-24 NOTE — ED Notes (Addendum)
**  Lopressor given. Now unable to do stress echo. EDP Drs Freida Busman & Palumbo aware. CT aware, to consult with cardiology in morning for plan, next course of action**.  No changes, pt resting/ sleeping, husband at [facility], (denies: pain or other sx). VSS.

## 2012-04-24 NOTE — ED Provider Notes (Signed)
Pt sent to the CDU for stress test in the morning. She had a CT angio of the chest done yesterday and can not get a CT angiogram done because she already received contrast  but received Metoprolol in preparation for the angio. She can no longer stress due to low BP and low heart rate  Dr. Verdis Prime, [facility] came to evaluate patient per request. He feels that the patient can do outpatient stress test. The pt however wants [facility] to manage her cardiac care. Therefore Christain Sacramento, NP  With [facility] has scheduled her outpatient stress and she will follow-up with this practice. Pt has been given this information and is understanding of her Stress time and location  Will dc with follow-up information.  Pt has been advised of the symptoms that warrant their return to the ED. Patient has voiced understanding and has agreed to follow-up with the PCP or specialist.   Dorthula Matas, PA 04/24/12 [phone]

## 2012-04-25 NOTE — ED Provider Notes (Signed)
Medical screening examination/treatment/procedure(s) were performed by non-physician practitioner and as supervising physician I was immediately available for consultation/collaboration.   Joya Gaskins, MD 04/25/12 [phone]

## 2012-04-28 ENCOUNTER — Other Ambulatory Visit (HOSPITAL_COMMUNITY): Payer: Self-pay | Admitting: *Deleted

## 2012-04-28 DIAGNOSIS — R079 Chest pain, unspecified: Secondary | ICD-10-CM

## 2012-04-29 ENCOUNTER — Ambulatory Visit (INDEPENDENT_AMBULATORY_CARE_PROVIDER_SITE_OTHER): Payer: 59 | Admitting: Cardiovascular Disease

## 2012-04-29 ENCOUNTER — Encounter: Payer: Self-pay | Admitting: Cardiovascular Disease

## 2012-04-29 VITALS — BP 122/82 | HR 103 | Ht 67.0 in | Wt 143.2 lb

## 2012-04-29 DIAGNOSIS — R9431 Abnormal electrocardiogram [ECG] [EKG]: Secondary | ICD-10-CM

## 2012-04-29 DIAGNOSIS — R0789 Other chest pain: Secondary | ICD-10-CM | POA: Insufficient documentation

## 2012-04-29 DIAGNOSIS — R079 Chest pain, unspecified: Secondary | ICD-10-CM

## 2012-04-29 NOTE — Patient Instructions (Addendum)
You are doing well. No medication changes were made.  Please call us if you have new issues that need to be addressed before your next appt.    

## 2012-04-29 NOTE — Progress Notes (Signed)
Patient ID: [name], female    DOB: 1961/01/05, 51 y.o.   MRN: [id number]  HPI Comments: Ms. [name] is a very pleasant 51 year old woman with remote history of smoking in her late teenage years with no family history of coronary disease, no history of diabetes, few risk factors in general who developed chest pain one week ago after eating. She had a nice lunch, that afternoon developed substernal chest pain. She took Prevacid for symptoms. Symptoms persisted into Tuesday. She had episode of weakness, malaise after eating lunch. She again took proton pump inhibitor. She continued to have symptoms on Wednesday also with symptoms of fatigue. Symptoms have started through the past week on and off. She went to the emergency room after being seen by primary care physician. EKG showed diffuse T wave abnormality through the anterior precordial leads. This was confirmed again in the emergency room.  Workup in the emergency room included a d-dimer which was positive, CT scan of the chest showing no PE, no coronary calcification, no pericardial effusion or other findings. Cardiac enzymes negative. Of note she had sinus tachycardia on EKG.  She was discharged home with followup.  She presents today and reports having continued mild chest discomfort. No change in symptoms with position. She is scheduled for stress test in 2 days time in [place].  EKG shows sinus tachycardia with rate 103 beats per minute, nonspecific T wave abnormality in V1 through V4 (improved compared to prior EKGs with resolution of T wave abnormality in V5 and V6)   Outpatient Encounter Prescriptions as of 04/29/2012  Medication Sig Dispense Refill  . cetirizine (ZYRTEC) 10 MG tablet Take 10 mg by mouth daily as needed. For congestion      . IBUPROFEN PO Take by mouth as needed.        Review of Systems  Constitutional: Negative.   HENT: Negative.   Eyes: Negative.   Respiratory: Negative.   Cardiovascular: Positive for  chest pain.  Gastrointestinal: Negative.   Musculoskeletal: Negative.   Skin: Negative.   Neurological: Negative.   Hematological: Negative.   Psychiatric/Behavioral: Negative.   All other systems reviewed and are negative.    BP 122/82  Pulse 103  Ht 5\' 7"  (1.702 m)  Wt 143 lb 4 oz (64.978 kg)  BMI 22.44 kg/m2  LMP 04/09/2012  Physical Exam  Nursing note and vitals reviewed. Constitutional: She is oriented to person, place, and time. She appears well-developed and well-nourished.  HENT:  Head: Normocephalic.  Nose: Nose normal.  Mouth/Throat: Oropharynx is clear and moist.  Eyes: Conjunctivae normal are normal. Pupils are equal, round, and reactive to light.  Neck: Normal range of motion. Neck supple. No JVD present.  Cardiovascular: Normal rate, regular rhythm, S1 normal, S2 normal, normal heart sounds and intact distal pulses.  Exam reveals no gallop and no friction rub.   No murmur heard. Pulmonary/Chest: Effort normal and breath sounds normal. No respiratory distress. She has no wheezes. She has no rales. She exhibits no tenderness.  Abdominal: Soft. Bowel sounds are normal. She exhibits no distension. There is no tenderness.  Musculoskeletal: Normal range of motion. She exhibits no edema and no tenderness.  Lymphadenopathy:    She has no cervical adenopathy.  Neurological: She is alert and oriented to person, place, and time. Coordination normal.  Skin: Skin is warm and dry. No rash noted. No erythema.  Psychiatric: She has a normal mood and affect. Her behavior is normal. Judgment and thought content normal.  Assessment and Plan

## 2012-04-29 NOTE — Assessment & Plan Note (Signed)
Diffuse T wave inversions noted in anterior precordial leads. Uncertain if this is her baseline. Stress test pending.

## 2012-04-29 NOTE — Assessment & Plan Note (Signed)
Chest pain is atypical in nature. Recent normal CT scan of the chest showing no coronary calcification and no pericardial effusion. Cardiac enzymes negative. She does have abnormal EKG though uncertain if this is her baseline. She is scheduled for treadmill Myoview in 2 days' time in [place]. If this shows no ischemia, no further cardiac workup would be needed. Echocardiogram could be performed though given no pericardial effusion seen on CT scan of the chest, this would likely be low in yield. No signs of heart failure. Unable to exclude GI etiology of symptoms less concerning for hiatal hernia. No belching or burning. If symptoms persist, I suggested she stay on her proton pump inhibitor, possibly considering low-dose NSAIDs for symptom relief.

## 2012-05-01 ENCOUNTER — Ambulatory Visit (HOSPITAL_COMMUNITY): Payer: 59 | Attending: Cardiovascular Disease | Admitting: Radiology

## 2012-05-01 VITALS — BP 126/77 | HR 76 | Ht 67.0 in | Wt 150.0 lb

## 2012-05-01 DIAGNOSIS — R0789 Other chest pain: Secondary | ICD-10-CM | POA: Insufficient documentation

## 2012-05-01 DIAGNOSIS — R079 Chest pain, unspecified: Secondary | ICD-10-CM

## 2012-05-01 DIAGNOSIS — R42 Dizziness and giddiness: Secondary | ICD-10-CM | POA: Insufficient documentation

## 2012-05-01 DIAGNOSIS — R002 Palpitations: Secondary | ICD-10-CM | POA: Insufficient documentation

## 2012-05-01 MED ORDER — TECHNETIUM TC 99M SESTAMIBI GENERIC - CARDIOLITE
33.0000 | Freq: Once | INTRAVENOUS | Status: AC | PRN
  Administered 2012-05-01: 33 via INTRAVENOUS

## 2012-05-01 MED ORDER — TECHNETIUM TC 99M SESTAMIBI GENERIC - CARDIOLITE
11.0000 | Freq: Once | INTRAVENOUS | Status: AC | PRN
  Administered 2012-05-01: 11 via INTRAVENOUS

## 2012-05-01 NOTE — Progress Notes (Signed)
[facility] SITE 3 NUCLEAR MED [address] [place], [place] [address] [phone]    Cardiology Nuclear Med Study  [name] is a 50 y.o. female     MRN : [id number]     DOB: March 16, 1961  Procedure Date: 05/01/2012  Nuclear Med Background Indication for Stress Test:  Evaluation for Ischemia and Abnormal EKG History:  No previously documented CAD; 04/23/12 CT:No coronary calcifications. Cardiac Risk Factors: History of Smoking  Symptoms:  Chest Pressure.  (last episode of chest discomfort is now, 2/10), Dizziness, Fatigue, Nausea and Palpitations   Nuclear Pre-Procedure Caffeine/Decaff Intake:  None NPO After: 8:00pm   Lungs:  Clear. O2 Sat: 98% on room air. IV 0.9% NS with Angio Cath:  22g  IV Site: R Antecubital  IV Started by:  Bonnita Levan, RN  Chest Size (in):  36 Cup Size: B  Height: 5\' 7"  (1.702 m)  Weight:  150 lb (68.04 kg)  BMI:  Body mass index is 23.49 kg/(m^2). Tech Comments:  N/A    Nuclear Med Study 1 or 2 day study: 1 day  Stress Test Type:  Stress  Reading MD: [place] Haws, MD  Order Authorizing Provider:  Marca Ancona, MD  Resting Radionuclide: Technetium 93m Sestamibi  Resting Radionuclide Dose: 11.0 mCi   Stress Radionuclide:  Technetium 69m Sestamibi  Stress Radionuclide Dose: 33.0 mCi           Stress Protocol Rest HR: 76 Stress HR: 169  Rest BP: 126/77 Stress BP: 160/81  Exercise Time (min): 8:01 METS: 10.1   Predicted Max HR: 171 bpm % Max HR: 98.83 bpm Rate Pressure Product: 21308    Dose of Adenosine (mg):  n/a Dose of Lexiscan: n/a mg  Dose of Atropine (mg): n/a Dose of Dobutamine: n/a mcg/kg/min (at max HR)  Stress Test Technologist: Smiley Houseman, CMA-N  Nuclear Technologist:  Domenic Polite, CNMT     Rest Procedure:  Myocardial perfusion imaging was performed at rest 45 minutes following the intravenous administration of Technetium 61m Sestamibi.  Rest ECG: NSR - Normal EKG  Stress Procedure:  The patient exercised  on the treadmill utilizing the Bruce Protocol for 8:01 minutes. The patient stopped due to fatigue.  She c/o chest pressure, 2/10, prior to exercise that did not increase with exercise.  Technetium 39m Sestamibi was injected at peak exercise and myocardial perfusion imaging was performed after a brief delay.  Stress ECG: No significant change from baseline ECG  QPS Raw Data Images:  Normal; no motion artifact; normal heart/lung ratio. Stress Images:  Normal homogeneous uptake in all areas of the myocardium. Rest Images:  Normal homogeneous uptake in all areas of the myocardium. Subtraction (SDS):  Normal Transient Ischemic Dilatation (Normal <1.22):  0.96 Lung/Heart Ratio (Normal <0.45):  0.39  Quantitative Gated Spect Images QGS EDV:  70 ml QGS ESV:  26 ml  Impression Exercise Capacity:  Fair exercise capacity. BP Response:  Normal blood pressure response. Clinical Symptoms:  Atypical chest pain. ECG Impression:  No significant ST segment change suggestive of ischemia. Comparison with Prior Nuclear Study: No previous nuclear study performed  Overall Impression:  Normal stress nuclear study.  LV Ejection Fraction: 62%.  LV Wall Motion:  NL LV Function; NL Wall Motion    [place] Haws

## 2012-05-02 ENCOUNTER — Encounter: Payer: Self-pay | Admitting: Family Medicine

## 2012-05-02 ENCOUNTER — Ambulatory Visit (INDEPENDENT_AMBULATORY_CARE_PROVIDER_SITE_OTHER): Payer: 59 | Admitting: Family Medicine

## 2012-05-02 VITALS — BP 120/70 | HR 86 | Temp 98.2°F | Ht 67.0 in | Wt 151.2 lb

## 2012-05-02 DIAGNOSIS — R079 Chest pain, unspecified: Secondary | ICD-10-CM

## 2012-05-02 MED ORDER — OMEPRAZOLE 20 MG PO CPDR: 20.0000 mg | DELAYED_RELEASE_CAPSULE | Freq: Every day | ORAL | Status: DC

## 2012-05-02 NOTE — Progress Notes (Signed)
Nature conservation officer at [facility] [address] [place] [address] Phone: [phone] Fax: [phone]  Date:  05/02/2012   Name:  [name]   DOB:  11-Dec-1960   MRN:  [id number] Gender: female Age: 51 y.o.  PCP:  Kerby Nora, MD  Evaluating MD: Hannah Beat, MD   Chief Complaint: Follow-up   History of Present Illness:  [name] is a 51 y.o. pleasant patient who presents with the following:  F/u ER visit last week, after I saw in the office, significant chest pain and anterior lead T wave inversion. Now feeling a lot better. Enzymes neg, CTA neg.   Impression Exercise Capacity:  Fair exercise capacity. BP Response:  Normal blood pressure response. Clinical Symptoms:  Atypical chest pain. ECG Impression:  No significant ST segment change suggestive of ischemia. Comparison with Prior Nuclear Study: No previous nuclear study performed  Overall Impression:  Normal stress nuclear study.  LV Ejection Fraction: 62%.  LV Wall Motion:  NL LV Function; NL Wall Motion    [name]   Patient Active Problem List  Diagnosis  . ALLERGIC RHINITIS  . ABNORMAL PAP SMEAR  . GASTROINTESTINAL HEMORRHAGE, HX OF  . Uterine fibroid  . Right groin pain  . Breast pain  . Chest pain  . Abnormal EKG    Past Medical History  Diagnosis Date  . Hemorrhage     gastrointestinal, associated with GI infection  . Abnormal glandular Papanicolaou smear of cervix   . NSVD (normal spontaneous vaginal delivery)       X 3  . History of cervical biopsy 1986    hemorrhage, negative biopsy    Past Surgical History  Procedure Date  . Tubal ligation   . Wisdom tooth extraction   . Colonoscopy     History  Substance Use Topics  . Smoking status: Former Smoker -- 0.2 packs/day for 5 years    Types: Cigarettes  . Smokeless tobacco: Not on file  . Alcohol Use: Yes     Comment: socially    Family History  Problem Relation Age of Onset  . Hyperlipidemia Father   .  Diabetes Maternal Aunt   . Cancer Paternal Grandfather     lung  . Heart disease Neg Hx     no MI's less than 55  . Cancer Maternal Aunt     breast, grandmother's sister  . Cancer Maternal Aunt     colon    Allergies  Allergen Reactions  . Cefuroxime Axetil Hives and Itching    Medication list has been reviewed and updated.  Outpatient Prescriptions Prior to Visit  Medication Sig Dispense Refill  . cetirizine (ZYRTEC) 10 MG tablet Take 10 mg by mouth daily as needed. For congestion      . IBUPROFEN PO Take by mouth as needed.       Last reviewed on 05/02/2012 11:58 AM by Consuello Masse, CMA  Review of Systems:  No fever, chills, no chest pain now  Physical Examination: BP 120/70  Pulse 86  Temp 98.2 F (36.8 C) (Oral)  Ht 5\' 7"  (1.702 m)  Wt 151 lb 4 oz (68.607 kg)  BMI 23.69 kg/m2  LMP 04/09/2012  Ideal Body Weight: Weight in (lb) to have BMI = 25: 159.3    GEN: WDWN, NAD, Non-toxic, A & O x 3 HEENT: Atraumatic, Normocephalic. Neck supple. No masses, No LAD. Ears and Nose: No external deformity. CV: RRR, No M/G/R. No JVD. No thrill. No  extra heart sounds. PULM: CTA B, no wheezes, crackles, rhonchi. No retractions. No resp. distress. No accessory muscle use. EXTR: No c/c/e NEURO Normal gait.  PSYCH: Normally interactive. Conversant. Not depressed or anxious appearing.  Calm demeanor.    Assessment and Plan:  1. Chest pain    Stress test and CTA neg. Very reassuring.  Potentially GERD CP, not MSK.  PPI for 2 mo  Orders Today:  No orders of the defined types were placed in this encounter.    Updated Medication List: (Includes new medications, updates to list, dose adjustments) Meds ordered this encounter  Medications  . omeprazole (PRILOSEC) 20 MG capsule    Sig: Take 1 capsule (20 mg total) by mouth daily.    Dispense:  30 capsule    Refill:  3    Medications Discontinued: There are no discontinued medications.   Hannah Beat, MD

## 2012-05-05 ENCOUNTER — Ambulatory Visit: Payer: 59 | Admitting: Family Medicine

## 2012-09-05 ENCOUNTER — Other Ambulatory Visit (INDEPENDENT_AMBULATORY_CARE_PROVIDER_SITE_OTHER): Payer: 59 | Admitting: *Deleted

## 2012-09-05 DIAGNOSIS — N390 Urinary tract infection, site not specified: Secondary | ICD-10-CM

## 2012-09-05 LAB — POCT URINALYSIS DIPSTICK
Bilirubin, UA: NEGATIVE
Glucose, UA: NEGATIVE
Ketones, UA: NEGATIVE
Protein, UA: NEGATIVE
Spec Grav, UA: 1.01

## 2012-09-05 MED ORDER — CIPROFLOXACIN HCL 500 MG PO TABS: 500.0000 mg | ORAL_TABLET | Freq: Two times a day (BID) | ORAL | Status: DC

## 2012-09-05 NOTE — Progress Notes (Signed)
Done

## 2012-09-05 NOTE — Addendum Note (Signed)
Addended by: Ronna Polio A on: 09/05/2012 09:38 AM   Modules accepted: Orders

## 2012-09-08 LAB — URINE CULTURE

## 2012-09-10 ENCOUNTER — Other Ambulatory Visit (INDEPENDENT_AMBULATORY_CARE_PROVIDER_SITE_OTHER): Payer: 59 | Admitting: *Deleted

## 2012-09-10 ENCOUNTER — Other Ambulatory Visit: Payer: Self-pay | Admitting: Internal Medicine

## 2012-09-10 DIAGNOSIS — N390 Urinary tract infection, site not specified: Secondary | ICD-10-CM

## 2012-09-10 LAB — POCT URINALYSIS DIPSTICK
Bilirubin, UA: NEGATIVE
Leukocytes, UA: NEGATIVE
Nitrite, UA: NEGATIVE
pH, UA: 5

## 2012-09-10 MED ORDER — SULFAMETHOXAZOLE-TRIMETHOPRIM 800-160 MG PO TABS: 1.0000 | ORAL_TABLET | Freq: Two times a day (BID) | ORAL | Status: DC

## 2012-09-11 ENCOUNTER — Other Ambulatory Visit: Payer: Self-pay | Admitting: *Deleted

## 2012-09-11 DIAGNOSIS — R319 Hematuria, unspecified: Secondary | ICD-10-CM

## 2012-09-13 LAB — URINE CULTURE: Colony Count: NO GROWTH

## 2012-09-16 ENCOUNTER — Encounter: Payer: Self-pay | Admitting: *Deleted

## 2013-03-10 ENCOUNTER — Ambulatory Visit (INDEPENDENT_AMBULATORY_CARE_PROVIDER_SITE_OTHER): Payer: 59 | Admitting: Internal Medicine

## 2013-03-10 ENCOUNTER — Encounter: Payer: Self-pay | Admitting: Internal Medicine

## 2013-03-10 VITALS — BP 120/80 | HR 85 | Temp 98.1°F | Wt 155.0 lb

## 2013-03-10 DIAGNOSIS — B9789 Other viral agents as the cause of diseases classified elsewhere: Secondary | ICD-10-CM | POA: Insufficient documentation

## 2013-03-10 DIAGNOSIS — J069 Acute upper respiratory infection, unspecified: Secondary | ICD-10-CM

## 2013-03-10 MED ORDER — HYDROCOD POLST-CHLORPHEN POLST 10-8 MG/5ML PO LQCR: 5.0000 mL | Freq: Two times a day (BID) | ORAL | Status: DC | PRN

## 2013-03-10 NOTE — Assessment & Plan Note (Signed)
Symptoms consistent with viral URI with cough. Will continue supportive care. Start prn Tussionex for cough. Discussed that this medication may make her drowsy. Follow up prn if symptoms not improving.

## 2013-03-10 NOTE — Progress Notes (Signed)
  Subjective:    Patient ID: [name], female    DOB: [birth year], 52 y.o.   MRN: [id number]  HPI 52YO female presents for acute visit. 1 week of dry, non-productive cough. Keeps her awake at night. No dyspnea, chest pain. No fever, chills. Pos post nasal drip. Daughter sick with similar symptoms 2 weeks ago. Taking Cetirizine with no improvement.   Outpatient Prescriptions Prior to Visit  Medication Sig Dispense Refill  . IBUPROFEN PO Take by mouth as needed.      Marland Kitchen omeprazole (PRILOSEC) 20 MG capsule Take 1 capsule (20 mg total) by mouth daily.  30 capsule  3  . cetirizine (ZYRTEC) 10 MG tablet Take 10 mg by mouth daily as needed. For congestion       No facility-administered medications prior to visit.   BP 120/80  Pulse 85  Temp(Src) 98.1 F (36.7 C) (Oral)  Wt 155 lb (70.308 kg)  SpO2 98%  Review of Systems  Constitutional: Negative for fever, chills and fatigue.  HENT: Positive for postnasal drip and rhinorrhea. Negative for congestion, sinus pressure and sore throat.   Respiratory: Positive for cough. Negative for chest tightness, shortness of breath and wheezing.   Gastrointestinal: Negative for abdominal pain.       Objective:   Physical Exam  Constitutional: She is oriented to person, place, and time. She appears well-developed and well-nourished. No distress.  HENT:  Head: Normocephalic and atraumatic.  Right Ear: External ear normal.  Left Ear: External ear normal.  Nose: Nose normal.  Mouth/Throat: Oropharynx is clear and moist. No oropharyngeal exudate.  Eyes: Conjunctivae are normal. Pupils are equal, round, and reactive to light. Right eye exhibits no discharge. Left eye exhibits no discharge. No scleral icterus.  Neck: Normal range of motion. Neck supple. No tracheal deviation present. No thyromegaly present.  Cardiovascular: Normal rate, regular rhythm, normal heart sounds and intact distal pulses.  Exam reveals no gallop and no friction rub.   No murmur  heard. Pulmonary/Chest: Effort normal and breath sounds normal. No respiratory distress. She has no wheezes. She has no rales. She exhibits no tenderness.  Musculoskeletal: Normal range of motion. She exhibits no edema and no tenderness.  Lymphadenopathy:    She has no cervical adenopathy.  Neurological: She is alert and oriented to person, place, and time. No cranial nerve deficit. She exhibits normal muscle tone. Coordination normal.  Skin: Skin is warm and dry. No rash noted. She is not diaphoretic. No erythema. No pallor.  Psychiatric: She has a normal mood and affect. Her behavior is normal. Judgment and thought content normal.          Assessment & Plan:

## 2013-03-10 NOTE — Progress Notes (Signed)
Pre-visit discussion using our clinic review tool. No additional management support is needed unless otherwise documented below in the visit note.  

## 2013-06-30 ENCOUNTER — Encounter: Payer: Self-pay | Admitting: Adult Health

## 2013-06-30 ENCOUNTER — Ambulatory Visit (INDEPENDENT_AMBULATORY_CARE_PROVIDER_SITE_OTHER): Payer: 59 | Admitting: Adult Health

## 2013-06-30 DIAGNOSIS — N390 Urinary tract infection, site not specified: Secondary | ICD-10-CM

## 2013-06-30 DIAGNOSIS — R300 Dysuria: Secondary | ICD-10-CM

## 2013-06-30 LAB — POCT URINALYSIS DIPSTICK
BILIRUBIN UA: NEGATIVE
GLUCOSE UA: NEGATIVE
Ketones, UA: NEGATIVE
LEUKOCYTES UA: NEGATIVE
NITRITE UA: NEGATIVE
Protein, UA: NEGATIVE
Spec Grav, UA: <=1.005
Urobilinogen, UA: 0.2
pH, UA: 6

## 2013-06-30 MED ORDER — SULFAMETHOXAZOLE-TRIMETHOPRIM 800-160 MG PO TABS: 1.0000 | ORAL_TABLET | Freq: Two times a day (BID) | ORAL | Status: DC

## 2013-06-30 NOTE — Progress Notes (Signed)
Patient ID: [name], female   DOB: 1960/08/16, 53 y.o.   MRN: [id number]  Dysuria that started yesterday. No fever, chills, hematuria or flank pain reported. Hx reviewed as well as pt allergies.

## 2013-06-30 NOTE — Assessment & Plan Note (Addendum)
Start Septra DS bid x 3 days. Sending urine for culture.

## 2013-07-01 LAB — URINE CULTURE
COLONY COUNT: NO GROWTH
ORGANISM ID, BACTERIA: NO GROWTH

## 2014-03-17 ENCOUNTER — Ambulatory Visit (INDEPENDENT_AMBULATORY_CARE_PROVIDER_SITE_OTHER): Payer: 59 | Admitting: Family Medicine

## 2014-03-17 ENCOUNTER — Encounter: Payer: Self-pay | Admitting: Family Medicine

## 2014-03-17 VITALS — BP 128/80 | HR 85 | Temp 98.0°F | Ht 66.0 in | Wt 162.2 lb

## 2014-03-17 DIAGNOSIS — F430 Acute stress reaction: Secondary | ICD-10-CM

## 2014-03-17 DIAGNOSIS — R000 Tachycardia, unspecified: Secondary | ICD-10-CM

## 2014-03-17 DIAGNOSIS — R002 Palpitations: Secondary | ICD-10-CM

## 2014-03-17 NOTE — Progress Notes (Signed)
Subjective:    Patient ID: [name], female    DOB: May 31, 1960, 53 y.o.   MRN: [id number]  HPI Here for racing heart   Yesterday and today - felt (during lunch) like heart was beating "hard" and felt like it would miss a beat  Feels fine now  Lasts 2-3 minutes and then start back   EKG NSR rate of 83   bp 18/80 Pulse mid 80s   She drinks one pepsi in the am and that is all  No coffee or other caffeine and she is trying to get rid of that (weaning)   Stress - some (worries about her daughter) Not a lot of work stress  Does not talk to anyone - went to counseling once - generally uncomfortable  Does not really talk to family or friends  Has not written in a journal   Does not exercise  Plans to start walking   Takes zyrtec (not D)   Patient Active Problem List   Diagnosis Date Noted  . Palpitations 03/17/2014  . Dysuria 06/30/2013  . Viral URI with cough 03/10/2013  . Chest pain 04/29/2012  . Abnormal EKG 04/29/2012  . Breast pain 04/22/2012  . Right groin pain 12/06/2011  . Uterine fibroid 10/06/2010  . ALLERGIC RHINITIS 07/07/2008  . ABNORMAL PAP SMEAR 02/28/2007  . GASTROINTESTINAL HEMORRHAGE, HX OF 02/28/2007   Past Medical History  Diagnosis Date  . Hemorrhage     gastrointestinal, associated with GI infection  . Abnormal glandular Papanicolaou smear of cervix   . NSVD (normal spontaneous vaginal delivery)       X 3  . History of cervical biopsy 1986    hemorrhage, negative biopsy   Past Surgical History  Procedure Laterality Date  . Tubal ligation    . Wisdom tooth extraction    . Colonoscopy     History  Substance Use Topics  . Smoking status: Former Smoker -- 0.25 packs/day for 5 years    Types: Cigarettes  . Smokeless tobacco: Never Used  . Alcohol Use: 0.0 oz/week    0 Not specified per week     Comment: socially   Family History  Problem Relation Age of Onset  . Hyperlipidemia Father   . Diabetes Maternal Aunt   . Cancer Paternal  Grandfather     lung  . Heart disease Neg Hx     no MI's less than 55  . Cancer Maternal Aunt     breast, grandmother's sister  . Cancer Maternal Aunt     colon   Allergies  Allergen Reactions  . Cefuroxime Axetil Hives and Itching  . Ciprofloxacin Hcl Swelling    Swelling of lips   Current Outpatient Prescriptions on File Prior to Visit  Medication Sig Dispense Refill  . cetirizine (ZYRTEC) 10 MG tablet Take 10 mg by mouth daily as needed. For congestion    . IBUPROFEN PO Take by mouth as needed.     No current facility-administered medications on file prior to visit.    Review of Systems Review of Systems  Constitutional: Negative for fever, appetite change, fatigue and unexpected weight change.  Eyes: Negative for pain and visual disturbance.  Respiratory: Negative for cough and shortness of breath.   Cardiovascular: Negative for cp or pnd/orthopnea or pedal edema or exercise intolerance     Gastrointestinal: Negative for nausea, diarrhea and constipation.  Genitourinary: Negative for urgency and frequency.  Skin: Negative for pallor or rash   Neurological: Negative for  weakness, light-headedness, numbness and headaches.  Hematological: Negative for adenopathy. Does not bruise/bleed easily.  Psychiatric/Behavioral: Negative for dysphoric mood. The patient is nervous/anxious. Pos for significant stressors         Objective:   Physical Exam  Constitutional: She appears well-developed and well-nourished. No distress.  HENT:  Head: Normocephalic and atraumatic.  Mouth/Throat: Oropharynx is clear and moist.  Eyes: Conjunctivae and EOM are normal. Pupils are equal, round, and reactive to light. No scleral icterus.  Neck: Normal range of motion. Neck supple. No JVD present. Carotid bruit is not present. No thyromegaly present.  Cardiovascular: Normal rate, regular rhythm, normal heart sounds and intact distal pulses.  Exam reveals no gallop and no friction rub.   No murmur  heard. Pulmonary/Chest: Effort normal and breath sounds normal. No respiratory distress. She has no wheezes. She has no rales.  No crackles   Musculoskeletal: She exhibits no edema or tenderness.  No palpable cords or edema Neg homans   Lymphadenopathy:    She has no cervical adenopathy.  Neurological: She is alert. She has normal reflexes. No cranial nerve deficit. She exhibits normal muscle tone. Coordination normal.  Skin: Skin is warm and dry. No rash noted. No erythema. No pallor.  Psychiatric: Her speech is normal and behavior is normal. Thought content normal. Her mood appears anxious. Her affect is not blunt, not labile and not inappropriate. She does not exhibit a depressed mood.  Tearful at times when discussing stressors           Assessment & Plan:   Problem List Items Addressed This Visit      Other   Palpitations    Suspect this is multifactorial with stress playing a big role  Adv to quit caffeine entirely  Will also begin writing in a journal at least once per day (enc to vent to friends and family if she feels like she can)- she declines counseling right now  Reviewed stressors/ coping techniques/symptoms/ support sources/ tx options and side effects in detail today  Also recommend starting a walking program If no improvement or symptoms worsen/ re occur-rev parameters for going to the hospital /etc  Will likely recommend a heart monitor        Other Visit Diagnoses    Tachycardia    -  Primary    Relevant Orders       EKG 12-Lead (Completed)

## 2014-03-17 NOTE — Patient Instructions (Signed)
EKG and exam are reassuring today  Quit caffeine entirely  Start writing in a journal - just to vent and get feelings out of your head  Talk to someone if you feel comfortable  Start walking - regularly - advance as tolerated  Keep me updated -if no improvement you may need a heart monitor

## 2014-03-17 NOTE — Assessment & Plan Note (Signed)
Suspect this is multifactorial with stress playing a big role  Adv to quit caffeine entirely  Will also begin writing in a journal at least once per day (enc to vent to friends and family if she feels like she can)- she declines counseling right now  Reviewed stressors/ coping techniques/symptoms/ support sources/ tx options and side effects in detail today  Also recommend starting a walking program If no improvement or symptoms worsen/ re occur-rev parameters for going to the hospital /etc  Will likely recommend a heart monitor

## 2014-03-17 NOTE — Progress Notes (Signed)
Pre visit review using our clinic review tool, if applicable. No additional management support is needed unless otherwise documented below in the visit note. 

## 2014-03-18 DIAGNOSIS — F430 Acute stress reaction: Secondary | ICD-10-CM | POA: Insufficient documentation

## 2014-03-18 NOTE — Assessment & Plan Note (Signed)
See assessment for palpitations  Declines ref for counseling  Disc strategy of writing in a journal

## 2014-06-22 ENCOUNTER — Ambulatory Visit (INDEPENDENT_AMBULATORY_CARE_PROVIDER_SITE_OTHER): Payer: 59 | Admitting: Family Medicine

## 2014-06-22 ENCOUNTER — Encounter: Payer: Self-pay | Admitting: Family Medicine

## 2014-06-22 VITALS — BP 104/70 | HR 89 | Temp 97.6°F | Ht 66.25 in | Wt 160.8 lb

## 2014-06-22 DIAGNOSIS — M19041 Primary osteoarthritis, right hand: Secondary | ICD-10-CM

## 2014-06-22 DIAGNOSIS — M19049 Primary osteoarthritis, unspecified hand: Secondary | ICD-10-CM | POA: Insufficient documentation

## 2014-06-22 MED ORDER — DICLOFENAC SODIUM 75 MG PO TBEC: 75.0000 mg | DELAYED_RELEASE_TABLET | Freq: Two times a day (BID) | ORAL | Status: DC

## 2014-06-22 NOTE — Patient Instructions (Signed)
Likely OA. Treat flare with diclofenac twice daily as needed. If not improving as expected can consider further eval.

## 2014-06-22 NOTE — Progress Notes (Signed)
   Subjective:    Patient ID: [name], female    DOB: 09-04-1960, 54 y.o.   MRN: [id number]  HPI 54 year old female presents with new onset right pinky pain. She has noted swelling and pain in right PIP joint of 5th digit. Ongoing 2-3 month, worsening in last month. Pain occurs with bending.  No injury, no fall. No rash.  No other joint pain. No new exposures,  No new meds   Advil does not help 400 mg.  No family history of arthritis.     Review of Systems  Constitutional: Negative for fever and fatigue.  HENT: Negative for ear pain.   Eyes: Negative for pain.  Respiratory: Negative for chest tightness and shortness of breath.   Cardiovascular: Negative for chest pain, palpitations and leg swelling.  Gastrointestinal: Negative for abdominal pain.  Genitourinary: Negative for dysuria.       Objective:   Physical Exam  Constitutional: Vital signs are normal. She appears well-developed and well-nourished. She is cooperative.  Non-toxic appearance. She does not appear ill. No distress.  HENT:  Head: Normocephalic.  Right Ear: Hearing, tympanic membrane, external ear and ear canal normal. Tympanic membrane is not erythematous, not retracted and not bulging.  Left Ear: Hearing, tympanic membrane, external ear and ear canal normal. Tympanic membrane is not erythematous, not retracted and not bulging.  Nose: No mucosal edema or rhinorrhea. Right sinus exhibits no maxillary sinus tenderness and no frontal sinus tenderness. Left sinus exhibits no maxillary sinus tenderness and no frontal sinus tenderness.  Mouth/Throat: Uvula is midline, oropharynx is clear and moist and mucous membranes are normal.  Eyes: Conjunctivae, EOM and lids are normal. Pupils are equal, round, and reactive to light. Lids are everted and swept, no foreign bodies found.  Neck: Trachea normal and normal range of motion. Neck supple. Carotid bruit is not present. No thyroid mass and no thyromegaly present.    Cardiovascular: Normal rate, regular rhythm, S1 normal, S2 normal, normal heart sounds, intact distal pulses and normal pulses.  Exam reveals no gallop and no friction rub.   No murmur heard. Pulmonary/Chest: Effort normal and breath sounds normal. No tachypnea. No respiratory distress. She has no decreased breath sounds. She has no wheezes. She has no rhonchi. She has no rales.  Abdominal: Soft. Normal appearance and bowel sounds are normal. There is no tenderness.  Musculoskeletal:  Right 5th PIP joint deformed, swollen, no redness, no warmth and no ttp  full ROM  Neurological: She is alert.  Skin: Skin is warm, dry and intact. No rash noted.  Psychiatric: Her speech is normal and behavior is normal. Judgment and thought content normal. Her mood appears not anxious. Cognition and memory are normal. She does not exhibit a depressed mood.          Assessment & Plan:

## 2014-06-22 NOTE — Assessment & Plan Note (Signed)
Treat with NSAIDs, limit use. Expect repetitive flare. No indication for X-ray.

## 2014-06-22 NOTE — Progress Notes (Signed)
Pre visit review using our clinic review tool, if applicable. No additional management support is needed unless otherwise documented below in the visit note. 

## 2014-08-26 ENCOUNTER — Ambulatory Visit (INDEPENDENT_AMBULATORY_CARE_PROVIDER_SITE_OTHER): Payer: 59 | Admitting: Family Medicine

## 2014-08-26 ENCOUNTER — Encounter: Payer: Self-pay | Admitting: Family Medicine

## 2014-08-26 VITALS — BP 150/95 | HR 115 | Temp 97.8°F | Ht 66.25 in | Wt 154.5 lb

## 2014-08-26 DIAGNOSIS — F321 Major depressive disorder, single episode, moderate: Secondary | ICD-10-CM | POA: Diagnosis not present

## 2014-08-26 DIAGNOSIS — F411 Generalized anxiety disorder: Secondary | ICD-10-CM

## 2014-08-26 DIAGNOSIS — G4700 Insomnia, unspecified: Secondary | ICD-10-CM | POA: Diagnosis not present

## 2014-08-26 MED ORDER — SERTRALINE HCL 50 MG PO TABS: 50.0000 mg | ORAL_TABLET | Freq: Every day | ORAL | Status: DC

## 2014-08-26 MED ORDER — ALPRAZOLAM 0.25 MG PO TABS: 0.2500 mg | ORAL_TABLET | Freq: Two times a day (BID) | ORAL | Status: DC | PRN

## 2014-08-26 NOTE — Progress Notes (Signed)
Pre visit review using our clinic review tool, if applicable. No additional management support is needed unless otherwise documented below in the visit note. 

## 2014-08-26 NOTE — Progress Notes (Signed)
Subjective:    Patient ID: [name], female    DOB: Dec 28, 1960, 54 y.o.   MRN: [id number]  HPI  54 year old female presents with new onset anxiety for months worse in last few weeks. She is tearful, crying off and on. She feels overwhelmed.  Early morning waking. She is more short tempered at home. She wants to be left alone. Trying to isolate herself. She has family stressors. No SI, no HI. Worrying constantly.  Not eating well. She took her husbands Xanax.. Temporarily calmed her down.  BP Readings from Last 3 Encounters:  08/26/14 150/95  06/22/14 104/70  03/17/14 128/80  HR today 115  No history of mood disorder.  She has an appt on 5/10 to counselor at [place]  Constitutional: Negative for fever and fatigue.  HENT: Negative for ear pain.   Eyes: Negative for pain.  Respiratory: Negative for chest tightness and shortness of breath.   Cardiovascular: Negative for chest pain, palpitations and leg swelling.  Gastrointestinal: Negative for abdominal pain.  Genitourinary: Negative for dysuria.       Objective:   Physical Exam  Constitutional: Vital signs are normal. She appears well-developed and well-nourished. She is cooperative.  Non-toxic appearance. She does not appear ill. No distress.  HENT:  Head: Normocephalic.  Right Ear: Hearing, tympanic membrane, external ear and ear canal normal. Tympanic membrane is not erythematous, not retracted and not bulging.  Left Ear: Hearing, tympanic membrane, external ear and ear canal normal. Tympanic membrane is not erythematous, not retracted and not bulging.  Nose: No mucosal edema or rhinorrhea. Right sinus exhibits no maxillary sinus tenderness and no frontal sinus tenderness. Left sinus exhibits no maxillary sinus tenderness and no frontal sinus tenderness.  Mouth/Throat: Uvula is midline, oropharynx is clear and moist and mucous membranes are normal.  Eyes: Conjunctivae, EOM and lids  are normal. Pupils are equal, round, and reactive to light. Lids are everted and swept, no foreign bodies found.  Neck: Trachea normal and normal range of motion. Neck supple. Carotid bruit is not present. No thyroid mass and no thyromegaly present.  Cardiovascular: Normal rate, regular rhythm, S1 normal, S2 normal, normal heart sounds, intact distal pulses and normal pulses.  Exam reveals no gallop and no friction rub.   No murmur heard. Pulmonary/Chest: Effort normal and breath sounds normal. No tachypnea. No respiratory distress. She has no decreased breath sounds. She has no wheezes. She has no rhonchi. She has no rales.  Abdominal: Soft. Normal appearance and bowel sounds are normal. There is no tenderness.  Neurological: She is alert.  Skin: Skin is warm, dry and intact. No rash noted.  Psychiatric: Her speech is normal. Judgment and thought content normal. Her mood appears anxious. Her affect is labile. She is agitated. Cognition and memory are normal. Cognition and memory are not impaired. She exhibits a depressed mood. She expresses no homicidal and no suicidal ideation. She expresses no suicidal plans and no homicidal plans.  tearful          Assessment & Plan:  MDD/GAD/insomnia:  Discussed options. Will start SSRI, use anxiolytic twice daily ( alprazolam low dose) temporarily until SSRI begins to become effective. Reviewed meds in detail, expected course, possible SE and length SE will last etc. Recommended counseling which she has set up thorugh place of work. NO SI, no HI.  Will begin treatment of insomnia with SSRI, but she may need something else like trazodone if sleep not  improving as expected. Encouraged stress reduction, relaxation, time for self etc. Total visit time 25 minutes, > 50% spent counseling and cordinating patients care.

## 2014-08-26 NOTE — Patient Instructions (Signed)
Start sertraline at bedtime 50 mg daily. Can use xanax as needed twice daily.

## 2014-08-27 DIAGNOSIS — G4700 Insomnia, unspecified: Secondary | ICD-10-CM | POA: Insufficient documentation

## 2014-08-27 DIAGNOSIS — Z8659 Personal history of other mental and behavioral disorders: Secondary | ICD-10-CM

## 2014-08-27 DIAGNOSIS — F411 Generalized anxiety disorder: Secondary | ICD-10-CM | POA: Insufficient documentation

## 2014-08-27 DIAGNOSIS — F4321 Adjustment disorder with depressed mood: Secondary | ICD-10-CM | POA: Insufficient documentation

## 2014-08-27 DIAGNOSIS — F3340 Major depressive disorder, recurrent, in remission, unspecified: Secondary | ICD-10-CM | POA: Insufficient documentation

## 2014-08-31 ENCOUNTER — Telehealth: Payer: Self-pay | Admitting: *Deleted

## 2014-08-31 NOTE — Telephone Encounter (Signed)
Patient starting taking Zoloft that was prescribed on 08/26/14.  Since starting it, she reports feeling as though her heart is pounding and she has some "heaviness" in her chest.  No chest pain or any other symptoms.  She would like to discuss these symptoms with you and is requesting a call back.

## 2014-08-31 NOTE — Telephone Encounter (Signed)
Discussed with pt.  Heart racing most leiky due to anxiety as improved with xanax. Not clearly sertraline SE. Continue sertraline , drink water and use xanax daily as needed.

## 2014-09-21 ENCOUNTER — Encounter: Payer: Self-pay | Admitting: Family Medicine

## 2014-09-21 ENCOUNTER — Ambulatory Visit (INDEPENDENT_AMBULATORY_CARE_PROVIDER_SITE_OTHER): Payer: 59 | Admitting: Family Medicine

## 2014-09-21 VITALS — BP 100/60 | HR 73 | Temp 98.3°F | Ht 66.25 in | Wt 153.0 lb

## 2014-09-21 DIAGNOSIS — F321 Major depressive disorder, single episode, moderate: Secondary | ICD-10-CM | POA: Diagnosis not present

## 2014-09-21 DIAGNOSIS — F411 Generalized anxiety disorder: Secondary | ICD-10-CM | POA: Diagnosis not present

## 2014-09-21 DIAGNOSIS — G4700 Insomnia, unspecified: Secondary | ICD-10-CM

## 2014-09-21 NOTE — Patient Instructions (Addendum)
Continue sertraline at this point at 50 mg daily.  Let me know if diarrhea is not improving or if you need to increase the dose.  Continue counseling.

## 2014-09-21 NOTE — Progress Notes (Signed)
Pre visit review using our clinic review tool, if applicable. No additional management support is needed unless otherwise documented below in the visit note. 

## 2014-09-21 NOTE — Progress Notes (Signed)
   Subjective:    Patient ID: [name], female    DOB: 02/25/1961, 54 y.o.   MRN: [id number]  HPI  54 year old female presents for 1 month follow up of depression, insomnia and generalized anxiety. At last OV she was started on  Sertraline 50 mg daily with alprazolam 0.25 mg to use prn.   Today she reports she initially felt ill with heart flutters and feeling like zombie, this has now resolved.  She does continue to have diarrhea off and on.   She is no longer crying no stop.She feels about 50 % improved with the medication.  She is sleeping better at night. Falling aselee and staying sasleep better.   She has not taken xanax in last 3-4 days.  She started counseling last week. Felt like it may not be helpful but will continue.     Review of Systems  Constitutional: Negative for fever and fatigue.  HENT: Negative for ear pain.   Eyes: Negative for pain.  Respiratory: Negative for chest tightness and shortness of breath.   Cardiovascular: Negative for chest pain, palpitations and leg swelling.  Gastrointestinal: Negative for abdominal pain.  Genitourinary: Negative for dysuria.       Objective:   Physical Exam  Constitutional: Vital signs are normal. She appears well-developed and well-nourished. She is cooperative.  Non-toxic appearance. She does not appear ill. No distress.  HENT:  Head: Normocephalic.  Right Ear: Hearing, tympanic membrane, external ear and ear canal normal. Tympanic membrane is not erythematous, not retracted and not bulging.  Left Ear: Hearing, tympanic membrane, external ear and ear canal normal. Tympanic membrane is not erythematous, not retracted and not bulging.  Nose: No mucosal edema or rhinorrhea. Right sinus exhibits no maxillary sinus tenderness and no frontal sinus tenderness. Left sinus exhibits no maxillary sinus tenderness and no frontal sinus tenderness.  Mouth/Throat: Uvula is midline, oropharynx is clear and moist and mucous membranes  are normal.  Eyes: Conjunctivae, EOM and lids are normal. Pupils are equal, round, and reactive to light. Lids are everted and swept, no foreign bodies found.  Neck: Trachea normal and normal range of motion. Neck supple. Carotid bruit is not present. No thyroid mass and no thyromegaly present.  Cardiovascular: Normal rate, regular rhythm, S1 normal, S2 normal, normal heart sounds, intact distal pulses and normal pulses.  Exam reveals no gallop and no friction rub.   No murmur heard. Pulmonary/Chest: Effort normal and breath sounds normal. No tachypnea. No respiratory distress. She has no decreased breath sounds. She has no wheezes. She has no rhonchi. She has no rales.  Abdominal: Soft. Normal appearance and bowel sounds are normal. There is no tenderness.  Neurological: She is alert.  Skin: Skin is warm, dry and intact. No rash noted.  Psychiatric: Her speech is normal and behavior is normal. Judgment and thought content normal. Her mood appears not anxious. Cognition and memory are normal. She does not exhibit a depressed mood.          Assessment & Plan:  MDD/GAD/insomnia: Significant improvement with sertraline 50 mg daily, using Xanax prn. Continue at current dose. Pt will let me know if SE not improving or if needing further improvement.

## 2014-09-28 ENCOUNTER — Ambulatory Visit: Payer: 59 | Admitting: Family Medicine

## 2014-11-30 ENCOUNTER — Telehealth: Payer: Self-pay | Admitting: Family Medicine

## 2014-11-30 MED ORDER — SULFAMETHOXAZOLE-TRIMETHOPRIM 800-160 MG PO TABS: 1.0000 | ORAL_TABLET | Freq: Two times a day (BID) | ORAL | Status: DC

## 2014-11-30 MED ORDER — FLUCONAZOLE 150 MG PO TABS: 150.0000 mg | ORAL_TABLET | Freq: Once | ORAL | Status: DC

## 2014-11-30 NOTE — Telephone Encounter (Signed)
Pt would like to have a UTI medication and also a medication for yeast infection.  Pharmacy of choice is [facility].

## 2014-11-30 NOTE — Telephone Encounter (Signed)
Pt going on extended trip to [place].   Rx 's sent in let, Zyionna know please.   HX of UTI and yeast after antibiotics.

## 2014-12-01 NOTE — Telephone Encounter (Signed)
Doloras notified prescriptions have been sent to [facility] in [place].

## 2015-01-18 ENCOUNTER — Encounter: Payer: Self-pay | Admitting: Family Medicine

## 2015-01-18 ENCOUNTER — Ambulatory Visit: Payer: 59 | Admitting: Family Medicine

## 2015-01-18 ENCOUNTER — Ambulatory Visit (INDEPENDENT_AMBULATORY_CARE_PROVIDER_SITE_OTHER): Payer: 59 | Admitting: Family Medicine

## 2015-01-18 VITALS — BP 120/68 | HR 73 | Temp 98.5°F | Ht 66.25 in | Wt 163.2 lb

## 2015-01-18 DIAGNOSIS — F321 Major depressive disorder, single episode, moderate: Secondary | ICD-10-CM | POA: Diagnosis not present

## 2015-01-18 DIAGNOSIS — L249 Irritant contact dermatitis, unspecified cause: Secondary | ICD-10-CM

## 2015-01-18 DIAGNOSIS — B360 Pityriasis versicolor: Secondary | ICD-10-CM | POA: Diagnosis not present

## 2015-01-18 MED ORDER — BETAMETHASONE DIPROPIONATE 0.05 % EX CREA: TOPICAL_CREAM | Freq: Two times a day (BID) | CUTANEOUS | Status: DC

## 2015-01-18 MED ORDER — KETOCONAZOLE 2 % EX CREA: 1.0000 "application " | TOPICAL_CREAM | Freq: Every day | CUTANEOUS | Status: DC

## 2015-01-18 NOTE — Assessment & Plan Note (Signed)
Moderate control. Plans to see psychiatrist.

## 2015-01-18 NOTE — Progress Notes (Signed)
   Subjective:    Patient ID: [name], female    DOB: [birth year], 54 y.o.   MRN: [id number]  HPI   54 year old female presents with new onset rash x 3 months, getting worse gradually. ON elbow.. Nowhere else. She first note rash in June. Very itchy, thick white flaky , bumpy.  No blister, no pustules.  She has put on lotion noting else.  No new exposures.  Fam HX: skin issues  No personal history of skin issues.   She has appt wih Nicolasa Ducking for mood. Not doing great on sertraline. Next week.  Using clonazepam rarely.  She has been treat for tinea vesicolor in past, had recurrenc of white spots on chest this summer.. Request refill for treatment.         Review of Systems  Constitutional: Negative for fever and fatigue.  HENT: Negative for ear pain.   Eyes: Negative for pain.  Respiratory: Negative for shortness of breath.   Cardiovascular: Negative for chest pain.       Objective:   Physical Exam  Constitutional: Vital signs are normal. She appears well-developed and well-nourished. She is cooperative.  Non-toxic appearance. She does not appear ill. No distress.  HENT:  Head: Normocephalic.  Right Ear: Hearing, tympanic membrane, external ear and ear canal normal. Tympanic membrane is not erythematous, not retracted and not bulging.  Left Ear: Hearing, tympanic membrane, external ear and ear canal normal. Tympanic membrane is not erythematous, not retracted and not bulging.  Nose: No mucosal edema or rhinorrhea. Right sinus exhibits no maxillary sinus tenderness and no frontal sinus tenderness. Left sinus exhibits no maxillary sinus tenderness and no frontal sinus tenderness.  Mouth/Throat: Uvula is midline, oropharynx is clear and moist and mucous membranes are normal.  Eyes: Conjunctivae, EOM and lids are normal. Pupils are equal, round, and reactive to light. Lids are everted and swept, no foreign bodies found.  Neck: Trachea normal and normal range of motion.  Neck supple. Carotid bruit is not present. No thyroid mass and no thyromegaly present.  Cardiovascular: Normal rate, regular rhythm, S1 normal, S2 normal, normal heart sounds, intact distal pulses and normal pulses.  Exam reveals no gallop and no friction rub.   No murmur heard. Pulmonary/Chest: Effort normal and breath sounds normal. No tachypnea. No respiratory distress. She has no decreased breath sounds. She has no wheezes. She has no rhonchi. She has no rales.  Abdominal: Soft. Normal appearance and bowel sounds are normal. There is no tenderness.  Neurological: She is alert.  Skin: Skin is warm, dry and intact. Rash noted.     White patches on upper chest and upper back, tanned skin otherwise.  Dry thickened plaque on bilateral elbows with excoriation.  Psychiatric: Her speech is normal and behavior is normal. Judgment and thought content normal. Her mood appears not anxious. Cognition and memory are normal. She does not exhibit a depressed mood.          Assessment & Plan:

## 2015-01-18 NOTE — Assessment & Plan Note (Signed)
Treat with ketoconazole cream daily x 4 weeks.

## 2015-01-18 NOTE — Patient Instructions (Addendum)
Apply steroid cream twice daily x 2 weeks. Call for derm referral if not improving as expected.  Apply antifungal cream to chest and upper back for tinea versicolor daily x 4 weeks.

## 2015-01-18 NOTE — Progress Notes (Signed)
Pre visit review using our clinic review tool, if applicable. No additional management support is needed unless otherwise documented below in the visit note. 

## 2015-01-18 NOTE — Assessment & Plan Note (Signed)
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  Get mat/cushion to put elbows on.  treat with topical steroid cream.

## 2015-03-02 ENCOUNTER — Other Ambulatory Visit: Payer: Self-pay | Admitting: Family Medicine

## 2015-03-02 NOTE — Telephone Encounter (Signed)
Last office visit 01/18/2015.  Last refilled 08/26/2014 for #60 with no refills.  Ok to refill.

## 2015-03-03 NOTE — Telephone Encounter (Signed)
Alprazolam called in [facility].

## 2015-05-25 ENCOUNTER — Ambulatory Visit (INDEPENDENT_AMBULATORY_CARE_PROVIDER_SITE_OTHER): Payer: 59 | Admitting: Family Medicine

## 2015-05-25 ENCOUNTER — Encounter: Payer: Self-pay | Admitting: Family Medicine

## 2015-05-25 VITALS — BP 142/90 | HR 94 | Temp 98.5°F | Ht 66.25 in | Wt 168.0 lb

## 2015-05-25 DIAGNOSIS — L723 Sebaceous cyst: Secondary | ICD-10-CM | POA: Insufficient documentation

## 2015-05-25 NOTE — Progress Notes (Signed)
Pre visit review using our clinic review tool, if applicable. No additional management support is needed unless otherwise documented below in the visit note. 

## 2015-05-25 NOTE — Progress Notes (Signed)
Subjective:    Patient ID: [name], female    DOB: February 23, 1961, 55 y.o.   MRN: [id number]  HPI Here with a knot on the back of her head (L side)  Was always there (years) Now it is getting a little bigger  Her headset rubs it   Was a little tender yesterday  Not today   Patient Active Problem List   Diagnosis Date Noted  . Sebaceous cyst 05/25/2015  . Tinea versicolor 01/18/2015  . Irritant dermatitis 01/18/2015  . Major depressive disorder, single episode, moderate ([place]) 08/27/2014  . Generalized anxiety disorder 08/27/2014  . Insomnia 08/27/2014  . OA (osteoarthritis) of finger 06/22/2014  . Uterine fibroid 10/06/2010  . ALLERGIC RHINITIS 07/07/2008  . ABNORMAL PAP SMEAR 02/28/2007  . GASTROINTESTINAL HEMORRHAGE, HX OF 02/28/2007   Past Medical History  Diagnosis Date  . Hemorrhage     gastrointestinal, associated with GI infection  . Abnormal glandular Papanicolaou smear of cervix   . NSVD (normal spontaneous vaginal delivery)       X 3  . History of cervical biopsy 1986    hemorrhage, negative biopsy   Past Surgical History  Procedure Laterality Date  . Tubal ligation    . Wisdom tooth extraction    . Colonoscopy     Social History  Substance Use Topics  . Smoking status: Former Smoker -- 0.25 packs/day for 5 years    Types: Cigarettes  . Smokeless tobacco: Never Used  . Alcohol Use: 0.0 oz/week    0 Standard drinks or equivalent per week     Comment: socially   Family History  Problem Relation Age of Onset  . Hyperlipidemia Father   . Diabetes Maternal Aunt   . Cancer Paternal Grandfather     lung  . Heart disease Neg Hx     no MI's less than 55  . Cancer Maternal Aunt     breast, grandmother's sister  . Cancer Maternal Aunt     colon   Allergies  Allergen Reactions  . Cefuroxime Axetil Hives and Itching  . Ciprofloxacin Hcl Swelling    Swelling of lips   Current Outpatient Prescriptions on File Prior to Visit  Medication Sig Dispense  Refill  . ALPRAZolam (XANAX) 0.25 MG tablet TAKE 1 TABLET BY MOUTH TWICE DAILY AS NEEDED FOR ANXIETY 60 tablet 0  . betamethasone dipropionate (DIPROLENE) 0.05 % cream Apply topically 2 (two) times daily. 30 g 0  . budesonide (RHINOCORT AQUA) 32 MCG/ACT nasal spray Place 2 sprays into both nostrils daily.    . cetirizine (ZYRTEC) 10 MG tablet Take 10 mg by mouth daily as needed. For congestion    . IBUPROFEN PO Take by mouth as needed.    Marland Kitchen ketoconazole (NIZORAL) 2 % cream Apply 1 application topically daily. 30 g 1   No current facility-administered medications on file prior to visit.    Review of Systems Review of Systems  Constitutional: Negative for fever, appetite change, fatigue and unexpected weight change.  Eyes: Negative for pain and visual disturbance.  Respiratory: Negative for cough and shortness of breath.   Cardiovascular: Negative for cp or palpitations    Gastrointestinal: Negative for nausea, diarrhea and constipation.  Genitourinary: Negative for urgency and frequency.  Skin: Negative for pallor or rash  pos for lump on her head  Neurological: Negative for weakness, light-headedness, numbness and headaches.  Hematological: Negative for adenopathy. Does not bruise/bleed easily.  Psychiatric/Behavioral: Negative for dysphoric mood. The patient is  not nervous/anxious.         Objective:   Physical Exam  Constitutional: She appears well-developed and well-nourished. No distress.  Well appearing   HENT:  Head: Normocephalic and atraumatic.  Mouth/Throat: Oropharynx is clear and moist.  Eyes: Conjunctivae and EOM are normal. Pupils are equal, round, and reactive to light. No scleral icterus.  Neck: Normal range of motion. Neck supple.  Cardiovascular: Normal rate and regular rhythm.   Pulmonary/Chest: Effort normal and breath sounds normal.  Lymphadenopathy:    She has no cervical adenopathy.  Neurological: She is alert.  Skin: Skin is warm and dry. No rash noted.  No erythema. No pallor.  Soft 1.5 cm oval mass felt over L posterior scalp-nontender and mobile No warmth or erythema or drainage   Psychiatric: She has a normal mood and affect.          Assessment & Plan:   Problem List Items Addressed This Visit      Musculoskeletal and Integument   Sebaceous cyst - Primary    L posterior scalp No s/s of infection  Will observe for change or growth  Can ref to derm for removal if pt desires at any point

## 2015-05-25 NOTE — Patient Instructions (Signed)
I think you have a sebaceous cyst of your scalp  It does not appear to be infected  Watch for redness/ pain/ further increase in size or drainage We can refer to dermatology in the future if needed

## 2015-05-26 NOTE — Assessment & Plan Note (Signed)
L posterior scalp No s/s of infection  Will observe for change or growth  Can ref to derm for removal if pt desires at any point

## 2015-06-03 ENCOUNTER — Encounter (INDEPENDENT_AMBULATORY_CARE_PROVIDER_SITE_OTHER): Payer: Self-pay

## 2015-06-28 DIAGNOSIS — F4323 Adjustment disorder with mixed anxiety and depressed mood: Secondary | ICD-10-CM | POA: Diagnosis not present

## 2015-07-12 DIAGNOSIS — R12 Heartburn: Secondary | ICD-10-CM | POA: Diagnosis not present

## 2015-07-12 DIAGNOSIS — K219 Gastro-esophageal reflux disease without esophagitis: Secondary | ICD-10-CM | POA: Diagnosis not present

## 2015-08-09 DIAGNOSIS — F509 Eating disorder, unspecified: Secondary | ICD-10-CM | POA: Diagnosis not present

## 2015-08-15 DIAGNOSIS — F5000 Anorexia nervosa, unspecified: Secondary | ICD-10-CM | POA: Diagnosis not present

## 2015-08-15 DIAGNOSIS — R5383 Other fatigue: Secondary | ICD-10-CM | POA: Diagnosis not present

## 2015-08-15 DIAGNOSIS — F411 Generalized anxiety disorder: Secondary | ICD-10-CM | POA: Diagnosis not present

## 2015-08-15 DIAGNOSIS — F329 Major depressive disorder, single episode, unspecified: Secondary | ICD-10-CM | POA: Diagnosis not present

## 2015-08-15 DIAGNOSIS — F321 Major depressive disorder, single episode, moderate: Secondary | ICD-10-CM | POA: Diagnosis not present

## 2015-08-16 DIAGNOSIS — F5000 Anorexia nervosa, unspecified: Secondary | ICD-10-CM | POA: Diagnosis not present

## 2015-08-16 DIAGNOSIS — F321 Major depressive disorder, single episode, moderate: Secondary | ICD-10-CM | POA: Diagnosis not present

## 2015-08-29 DIAGNOSIS — F5000 Anorexia nervosa, unspecified: Secondary | ICD-10-CM | POA: Diagnosis not present

## 2015-08-29 DIAGNOSIS — R5383 Other fatigue: Secondary | ICD-10-CM | POA: Diagnosis not present

## 2015-08-29 DIAGNOSIS — R110 Nausea: Secondary | ICD-10-CM | POA: Diagnosis not present

## 2015-09-01 DIAGNOSIS — L2084 Intrinsic (allergic) eczema: Secondary | ICD-10-CM | POA: Diagnosis not present

## 2015-09-08 DIAGNOSIS — F331 Major depressive disorder, recurrent, moderate: Secondary | ICD-10-CM | POA: Diagnosis not present

## 2015-09-08 DIAGNOSIS — F5000 Anorexia nervosa, unspecified: Secondary | ICD-10-CM | POA: Diagnosis not present

## 2015-09-08 DIAGNOSIS — F411 Generalized anxiety disorder: Secondary | ICD-10-CM | POA: Diagnosis not present

## 2015-09-13 DIAGNOSIS — F331 Major depressive disorder, recurrent, moderate: Secondary | ICD-10-CM | POA: Diagnosis not present

## 2015-09-13 DIAGNOSIS — F5000 Anorexia nervosa, unspecified: Secondary | ICD-10-CM | POA: Diagnosis not present

## 2015-09-13 DIAGNOSIS — F509 Eating disorder, unspecified: Secondary | ICD-10-CM | POA: Diagnosis not present

## 2015-09-13 DIAGNOSIS — G4700 Insomnia, unspecified: Secondary | ICD-10-CM | POA: Diagnosis not present

## 2015-09-13 DIAGNOSIS — F411 Generalized anxiety disorder: Secondary | ICD-10-CM | POA: Diagnosis not present

## 2015-10-27 ENCOUNTER — Other Ambulatory Visit: Payer: Self-pay | Admitting: Family Medicine

## 2015-10-27 DIAGNOSIS — Z1231 Encounter for screening mammogram for malignant neoplasm of breast: Secondary | ICD-10-CM

## 2015-11-10 ENCOUNTER — Ambulatory Visit
Admission: RE | Admit: 2015-11-10 | Discharge: 2015-11-10 | Disposition: A | Discharge status: Home / Self Care | Payer: 59 | Source: Ambulatory Visit | Attending: Family Medicine | Admitting: Family Medicine

## 2015-11-10 DIAGNOSIS — Z1231 Encounter for screening mammogram for malignant neoplasm of breast: Secondary | ICD-10-CM

## 2015-11-11 ENCOUNTER — Other Ambulatory Visit: Payer: Self-pay | Admitting: Family Medicine

## 2015-11-11 DIAGNOSIS — R928 Other abnormal and inconclusive findings on diagnostic imaging of breast: Secondary | ICD-10-CM

## 2015-11-18 ENCOUNTER — Other Ambulatory Visit: Payer: Self-pay | Admitting: Family Medicine

## 2015-11-18 ENCOUNTER — Other Ambulatory Visit: Payer: Self-pay | Admitting: General Practice

## 2015-11-18 DIAGNOSIS — R928 Other abnormal and inconclusive findings on diagnostic imaging of breast: Secondary | ICD-10-CM

## 2015-11-21 ENCOUNTER — Ambulatory Visit
Admission: RE | Admit: 2015-11-21 | Discharge: 2015-11-21 | Disposition: A | Discharge status: Home / Self Care | Payer: 59 | Source: Ambulatory Visit | Attending: Family Medicine | Admitting: Family Medicine

## 2015-11-21 DIAGNOSIS — R928 Other abnormal and inconclusive findings on diagnostic imaging of breast: Secondary | ICD-10-CM

## 2015-11-21 DIAGNOSIS — R922 Inconclusive mammogram: Secondary | ICD-10-CM | POA: Diagnosis not present

## 2015-11-21 DIAGNOSIS — N6002 Solitary cyst of left breast: Secondary | ICD-10-CM | POA: Diagnosis not present

## 2016-02-13 ENCOUNTER — Telehealth: Payer: Self-pay | Admitting: Family Medicine

## 2016-02-13 ENCOUNTER — Other Ambulatory Visit (INDEPENDENT_AMBULATORY_CARE_PROVIDER_SITE_OTHER): Payer: 59

## 2016-02-13 DIAGNOSIS — Z1159 Encounter for screening for other viral diseases: Secondary | ICD-10-CM

## 2016-02-13 DIAGNOSIS — Z13220 Encounter for screening for lipoid disorders: Secondary | ICD-10-CM | POA: Diagnosis not present

## 2016-02-13 NOTE — Telephone Encounter (Signed)
-----   Message from Marchia Bond sent at 02/03/2016 10:55 AM EDT ----- Regarding: Cpx labs Mon 10/9, need orders. Thanks! :-) Please order  future cpx labs for pt's upcoming lab appt, and pt wants Hep C labs. Thanks Aniceto Boss

## 2016-02-14 ENCOUNTER — Encounter: Payer: Self-pay | Admitting: Family Medicine

## 2016-02-14 ENCOUNTER — Other Ambulatory Visit (HOSPITAL_COMMUNITY)
Admission: RE | Admit: 2016-02-14 | Discharge: 2016-02-14 | Disposition: A | Discharge status: Home / Self Care | Payer: 59 | Source: Ambulatory Visit | Attending: Family Medicine | Admitting: Family Medicine

## 2016-02-14 ENCOUNTER — Ambulatory Visit (INDEPENDENT_AMBULATORY_CARE_PROVIDER_SITE_OTHER): Payer: 59 | Admitting: Family Medicine

## 2016-02-14 VITALS — BP 122/73 | HR 88 | Temp 97.7°F | Ht 66.25 in | Wt 167.8 lb

## 2016-02-14 DIAGNOSIS — F3340 Major depressive disorder, recurrent, in remission, unspecified: Secondary | ICD-10-CM | POA: Diagnosis not present

## 2016-02-14 DIAGNOSIS — K644 Residual hemorrhoidal skin tags: Secondary | ICD-10-CM | POA: Diagnosis not present

## 2016-02-14 DIAGNOSIS — Z1151 Encounter for screening for human papillomavirus (HPV): Secondary | ICD-10-CM | POA: Diagnosis not present

## 2016-02-14 DIAGNOSIS — Z01419 Encounter for gynecological examination (general) (routine) without abnormal findings: Secondary | ICD-10-CM | POA: Diagnosis not present

## 2016-02-14 DIAGNOSIS — Z6826 Body mass index (BMI) 26.0-26.9, adult: Secondary | ICD-10-CM | POA: Diagnosis not present

## 2016-02-14 DIAGNOSIS — Z124 Encounter for screening for malignant neoplasm of cervix: Secondary | ICD-10-CM

## 2016-02-14 DIAGNOSIS — Z01411 Encounter for gynecological examination (general) (routine) with abnormal findings: Secondary | ICD-10-CM | POA: Insufficient documentation

## 2016-02-14 DIAGNOSIS — Z0000 Encounter for general adult medical examination without abnormal findings: Secondary | ICD-10-CM

## 2016-02-14 LAB — COMPREHENSIVE METABOLIC PANEL
ALK PHOS: 111 U/L (ref 39–117)
ALT: 46 U/L — ABNORMAL HIGH (ref 0–35)
AST: 25 U/L (ref 0–37)
Albumin: 4.3 g/dL (ref 3.5–5.2)
BUN: 11 mg/dL (ref 6–23)
CHLORIDE: 103 meq/L (ref 96–112)
CO2: 30 mEq/L (ref 19–32)
Calcium: 10.2 mg/dL (ref 8.4–10.5)
Creatinine, Ser: 0.91 mg/dL (ref 0.40–1.20)
GFR: 68.64 mL/min (ref >60.00)
GLUCOSE: 79 mg/dL (ref 70–99)
POTASSIUM: 3.9 meq/L (ref 3.5–5.1)
SODIUM: 140 meq/L (ref 135–145)
TOTAL PROTEIN: 7.1 g/dL (ref 6.0–8.3)
Total Bilirubin: 0.4 mg/dL (ref 0.2–1.2)

## 2016-02-14 LAB — HEPATITIS C ANTIBODY: HCV Ab: NEGATIVE

## 2016-02-14 LAB — LIPID PANEL
Cholesterol: 221 mg/dL — ABNORMAL HIGH (ref 0–200)
HDL: 57.7 mg/dL (ref >39.00)
LDL CALC: 132 mg/dL — AB (ref 0–99)
NONHDL: 163.59
Total CHOL/HDL Ratio: 4
Triglycerides: 159 mg/dL — ABNORMAL HIGH (ref 0.0–149.0)
VLDL: 31.8 mg/dL (ref 0.0–40.0)

## 2016-02-14 MED ORDER — HYDROCORTISONE ACE-PRAMOXINE 2.5-1 % RE CREA: 1.0000 "application " | TOPICAL_CREAM | Freq: Two times a day (BID) | RECTAL | 0 refills | Status: DC

## 2016-02-14 NOTE — Addendum Note (Signed)
Addended by: Carter Kitten on: 02/14/2016 02:50 PM   Modules accepted: Orders

## 2016-02-14 NOTE — Assessment & Plan Note (Signed)
Treat with topical steroid cream prn. Avoid straining and constipation.

## 2016-02-14 NOTE — Progress Notes (Signed)
Pre visit review using our clinic review tool, if applicable. No additional management support is needed unless otherwise documented below in the visit note. 

## 2016-02-14 NOTE — Patient Instructions (Addendum)
Return for recheck of LFTs in 2 weeks after changes. Decrease fat and cholesterol in diet. Increase exercise as planned.   Fat and Cholesterol Restricted Diet Getting too much fat and cholesterol in your diet may cause health problems. Following this diet helps keep your fat and cholesterol at normal levels. This can keep you from getting sick. WHAT TYPES OF FAT SHOULD I CHOOSE?  Choose monosaturated and polyunsaturated fats. These are found in foods such as olive oil, canola oil, flaxseeds, walnuts, almonds, and seeds.  Eat more omega-3 fats. Good choices include salmon, mackerel, sardines, tuna, flaxseed oil, and ground flaxseeds.  Limit saturated fats. These are in animal products such as meats, butter, and cream. They can also be in plant products such as palm oil, palm kernel oil, and coconut oil.   Avoid foods with partially hydrogenated oils in them. These contain trans fats. Examples of foods that have trans fats are stick margarine, some tub margarines, cookies, crackers, and other baked goods. WHAT GENERAL GUIDELINES DO I NEED TO FOLLOW?   Check food labels. Look for the words "trans fat" and "saturated fat."  When preparing a meal:  Fill half of your plate with vegetables and green salads.  Fill one fourth of your plate with whole grains. Look for the word "whole" as the first word in the ingredient list.  Fill one fourth of your plate with lean protein foods.  Limit fruit to two servings a day. Choose fruit instead of juice.  Eat more foods with soluble fiber. Examples of foods with this type of fiber are apples, broccoli, carrots, beans, peas, and barley. Try to get 20-30 g (grams) of fiber per day.  Eat more home-cooked foods. Eat less at restaurants and buffets.  Limit or avoid alcohol.  Limit foods high in starch and sugar.  Limit fried foods.  Cook foods without frying them. Baking, boiling, grilling, and broiling are all great options.  Lose weight if you  are overweight. Losing even a small amount of weight can help your overall health. It can also help prevent diseases such as diabetes and heart disease. WHAT FOODS CAN I EAT? Grains Whole grains, such as whole wheat or whole grain breads, crackers, cereals, and pasta. Unsweetened oatmeal, bulgur, barley, quinoa, or brown rice. Corn or whole wheat flour tortillas. Vegetables Fresh or frozen vegetables (raw, steamed, roasted, or grilled). Green salads. Fruits All fresh, canned (in natural juice), or frozen fruits. Meat and Other Protein Products Ground beef (85% or leaner), grass-fed beef, or beef trimmed of fat. Skinless chicken or [place]. Ground chicken or [place]. Pork trimmed of fat. All fish and seafood. Eggs. Dried beans, peas, or lentils. Unsalted nuts or seeds. Unsalted canned or dry beans. Dairy Low-fat dairy products, such as skim or 1% milk, 2% or reduced-fat cheeses, low-fat ricotta or cottage cheese, or plain low-fat yogurt. Fats and Oils Tub margarines without trans fats. Light or reduced-fat mayonnaise and salad dressings. Avocado. Olive, canola, sesame, or safflower oils. Natural peanut or almond butter (choose ones without added sugar and oil). The items listed above may not be a complete list of recommended foods or beverages. Contact your dietitian for more options. WHAT FOODS ARE NOT RECOMMENDED? Grains White bread. White pasta. White rice. Cornbread. Bagels, pastries, and croissants. Crackers that contain trans fat. Vegetables White potatoes. Corn. Creamed or fried vegetables. Vegetables in a cheese sauce. Fruits Dried fruits. Canned fruit in light or heavy syrup. Fruit juice. Meat and Other Protein Products Fatty cuts of  meat. Ribs, chicken wings, bacon, sausage, bologna, salami, chitterlings, fatback, hot dogs, bratwurst, and packaged luncheon meats. Liver and organ meats. Dairy Whole or 2% milk, cream, half-and-half, and cream cheese. Whole milk cheeses. Whole-fat or  sweetened yogurt. Full-fat cheeses. Nondairy creamers and whipped toppings. Processed cheese, cheese spreads, or cheese curds. Sweets and Desserts Corn syrup, sugars, honey, and molasses. Candy. Jam and jelly. Syrup. Sweetened cereals. Cookies, pies, cakes, donuts, muffins, and ice cream. Fats and Oils Butter, stick margarine, lard, shortening, ghee, or bacon fat. Coconut, palm kernel, or palm oils. Beverages Alcohol. Sweetened drinks (such as sodas, lemonade, and fruit drinks or punches). The items listed above may not be a complete list of foods and beverages to avoid. Contact your dietitian for more information.   This information is not intended to replace advice given to you by your health care provider. Make sure you discuss any questions you have with your health care provider.   Document Released: 10/23/2011 Document Revised: 05/14/2014 Document Reviewed: 07/23/2013 Elsevier Interactive Patient Education [employer].

## 2016-02-14 NOTE — Assessment & Plan Note (Signed)
Resolved off medication. 

## 2016-02-14 NOTE — Progress Notes (Signed)
Subjective:    Patient ID: ROSSELYN DELOREY, female    DOB: 09-Oct-1960, 55 y.o.   MRN: [id number]  HPI  The patient is here for annual wellness exam and preventative care.    MDD, moderate, GAD: No longer on any medication. Well controlled. Insomnia, chronic: no longer on any medication.  LDL almost at goal < 130 on no med Lab Results  Component Value Date   CHOL 221 (H) 02/13/2016   HDL 57.70 02/13/2016   LDLCALC 132 (H) 02/13/2016   LDLDIRECT 125.7 07/08/2008   TRIG 159.0 (H) 02/13/2016   CHOLHDL 4 02/13/2016   BP Readings from Last 3 Encounters:  02/14/16 122/73  05/25/15 (!) 142/90  01/18/15 120/68   Body mass index is 26.87 kg/m.  Diet: Moderate  Exercise:  Starting walking daily  30 min and bowling weekly  ETOH:  2-3 every 2-3 days.  No tylenol, no family history  Has rectal itching. Had hemorrhoids after her children.  Has not tried anything for it. No bleeding, no pain.  No constipation.    Social History /Family History/Past Medical History reviewed and updated if needed.    Review of Systems  Constitutional: Negative for fatigue and fever.  HENT: Negative for congestion.   Eyes: Negative for pain.  Respiratory: Negative for cough and shortness of breath.   Cardiovascular: Negative for chest pain, palpitations and leg swelling.  Gastrointestinal: Negative for abdominal pain.  Genitourinary: Negative for dysuria and vaginal bleeding.  Musculoskeletal: Positive for back pain.  Neurological: Negative for syncope, light-headedness and headaches.  Psychiatric/Behavioral: Negative for dysphoric mood.       Objective:   Physical Exam  Constitutional: Vital signs are normal. She appears well-developed and well-nourished. She is cooperative.  Non-toxic appearance. She does not appear ill. No distress.  HENT:  Head: Normocephalic.  Right Ear: Hearing, tympanic membrane, external ear and ear canal normal.  Left Ear: Hearing, tympanic membrane, external ear  and ear canal normal.  Nose: Nose normal.  Eyes: Conjunctivae, EOM and lids are normal. Pupils are equal, round, and reactive to light. Lids are everted and swept, no foreign bodies found.  Neck: Trachea normal and normal range of motion. Neck supple. Carotid bruit is not present. No thyroid mass and no thyromegaly present.  Cardiovascular: Normal rate, regular rhythm, S1 normal, S2 normal, normal heart sounds and intact distal pulses.  Exam reveals no gallop.   No murmur heard. Pulmonary/Chest: Effort normal and breath sounds normal. No respiratory distress. She has no wheezes. She has no rhonchi. She has no rales.  Abdominal: Soft. Normal appearance and bowel sounds are normal. She exhibits no distension, no fluid wave, no abdominal bruit and no mass. There is no hepatosplenomegaly. There is no tenderness. There is no rebound, no guarding and no CVA tenderness. No hernia.  Genitourinary: Vagina normal and uterus normal. Rectal exam shows external hemorrhoid. Rectal exam shows no fissure, no mass, no tenderness and anal tone normal. No breast swelling, tenderness, discharge or bleeding. Pelvic exam was performed with patient supine. There is no rash, tenderness or lesion on the right labia. There is no rash, tenderness or lesion on the left labia. Uterus is not enlarged and not tender. Cervix exhibits no motion tenderness, no discharge and no friability. Right adnexum displays no mass, no tenderness and no fullness. Left adnexum displays no mass, no tenderness and no fullness.  Lymphadenopathy:    She has no cervical adenopathy.    She has no axillary adenopathy.  Neurological: She is alert. She has normal strength. No cranial nerve deficit or sensory deficit.  Skin: Skin is warm, dry and intact. No rash noted.  Psychiatric: Her speech is normal and behavior is normal. Judgment normal. Her mood appears not anxious. Cognition and memory are normal. She does not exhibit a depressed mood.            Assessment & Plan:  The patient's preventative maintenance and recommended screening tests for an annual wellness exam were reviewed in full today. Brought up to date unless services declined.  Counselled on the importance of diet, exercise, and its role in overall health and mortality. The patient's FH and SH was reviewed, including their home life, tobacco status, and drug and alcohol status.   Vaccines: flu at work,  Tdap up to date  PAP/DVE: due this year.    MAMMO: 11/2015 low risk  Colon: 04/2010  Brodie,repeat in 10 years Hep C neg. Nonsmoker  STD screen:

## 2016-02-15 LAB — CYTOLOGY - PAP

## 2016-08-16 ENCOUNTER — Encounter: Payer: Self-pay | Admitting: Cardiology

## 2016-08-16 ENCOUNTER — Ambulatory Visit (INDEPENDENT_AMBULATORY_CARE_PROVIDER_SITE_OTHER): Payer: 59

## 2016-08-16 ENCOUNTER — Ambulatory Visit (INDEPENDENT_AMBULATORY_CARE_PROVIDER_SITE_OTHER): Payer: 59 | Admitting: Cardiology

## 2016-08-16 VITALS — BP 110/62 | HR 89 | Ht 66.0 in | Wt 168.0 lb

## 2016-08-16 DIAGNOSIS — R002 Palpitations: Secondary | ICD-10-CM | POA: Diagnosis not present

## 2016-08-16 NOTE — Progress Notes (Addendum)
Cardiology Office Note   Date:  08/16/2016   ID:  [name], DOB 04/17/1961, MRN [id number]  Referring Doctor:  Eliezer Lofts, MD   Cardiologist:   Wende Bushy, MD   Reason for consultation:  Chief Complaint  Patient presents with  . other    palpitaions that comes and goes. Patient has seen Dr. Rockey Situ in 2013. Meds reviewed verbally with patient.      History of Present Illness: ELAH Shannon is a 56 y.o. female who presents for consultation palpitations at the request of PCP Dr. Diona Browner. Onset is 2 weeks.she feels her heart racing and pounding at times. They last 5-10 seconds at the time she feels short of breath afterwards. No known precipitating factors. Goes away by itself.no chest pain. Otherwise, no shortness of breath at other times.  Caffeine source of 16 ounces of Pepsi everyday and nothing else.  No PND, orthopnea, edema.   ROS:  Please see the history of present illness. Aside from mentioned under HPI, all other systems are reviewed and negative.     Past Medical History:  Diagnosis Date  . Abnormal glandular Papanicolaou smear of cervix   . Hemorrhage    gastrointestinal, associated with GI infection  . History of cervical biopsy 1986   hemorrhage, negative biopsy  . NSVD (normal spontaneous vaginal delivery)      X 3    Past Surgical History:  Procedure Laterality Date  . COLONOSCOPY    . TUBAL LIGATION    . WISDOM TOOTH EXTRACTION       reports that she has quit smoking. Her smoking use included Cigarettes. She has a 1.25 pack-year smoking history. She has never used smokeless tobacco. She reports that she drinks alcohol. She reports that she does not use drugs.   family history includes Cancer in her maternal aunt, maternal aunt, and paternal grandfather; Diabetes in her maternal aunt; Hyperlipidemia in her father.   Outpatient Medications Prior to Visit  Medication Sig Dispense Refill  . betamethasone dipropionate (DIPROLENE) 0.05 % cream  Apply topically 2 (two) times daily. 30 g 0  . hydrocortisone-pramoxine (ANALPRAM-HC) 2.5-1 % rectal cream Place 1 application rectally 2 (two) times daily. 30 g 0  . IBUPROFEN PO Take by mouth as needed.     No facility-administered medications prior to visit.      Allergies: Cefuroxime axetil and Ciprofloxacin hcl    PHYSICAL EXAM: VS:  BP 110/62 (BP Location: Right Arm, Patient Position: Sitting, Cuff Size: Normal)   Pulse 89   Ht 5\' 6"  (1.676 m)   Wt 168 lb (76.2 kg)   LMP 04/09/2012   BMI 27.12 kg/m  , Body mass index is 27.12 kg/m. Wt Readings from Last 3 Encounters:  08/16/16 168 lb (76.2 kg)  02/14/16 167 lb 12 oz (76.1 kg)  05/25/15 168 lb (76.2 kg)    GENERAL:  well developed, well nourished, not in acute distress HEENT: normocephalic, pink conjunctivae, anicteric sclerae, no xanthelasma, normal dentition, oropharynx clear NECK:  no neck vein engorgement, JVP normal, no hepatojugular reflux, carotid upstroke brisk and symmetric, no bruit, no thyromegaly, no lymphadenopathy LUNGS:  good respiratory effort, clear to auscultation bilaterally CV:  PMI not displaced, no thrills, no lifts, S1 and S2 within normal limits, no palpable S3 or S4, no murmurs, no rubs, no gallops ABD:  Soft, nontender, nondistended, normoactive bowel sounds, no abdominal aortic bruit, no hepatomegaly, no splenomegaly MS: nontender back, no kyphosis, no scoliosis, no joint deformities  EXT:  2+ DP/PT pulses, no edema, no varicosities, no cyanosis, no clubbing SKIN: warm, nondiaphoretic, normal turgor, no ulcers NEUROPSYCH: alert, oriented to person, place, and time, sensory/motor grossly intact, normal mood, appropriate affect  Recent Labs: 02/13/2016: ALT 46; BUN 11; Creatinine, Ser 0.91; Potassium 3.9; Sodium 140   Lipid Panel    Component Value Date/Time   CHOL 221 (H) 02/13/2016 1615   TRIG 159.0 (H) 02/13/2016 1615   TRIG 48 05/17/2006 1128   HDL 57.70 02/13/2016 1615   CHOLHDL 4  02/13/2016 1615   VLDL 31.8 02/13/2016 1615   LDLCALC 132 (H) 02/13/2016 1615   LDLDIRECT 125.7 07/08/2008 1157     Other studies Reviewed:  EKG:  The ekg from 08/16/2016 was personally reviewed by me and it revealed sinus rhythm, 89 BPM. Nonspecific ST-T wave changes. Abnormal EKG. Previous EKG from 04/29/2012 show similar ST-T wave changes.  Additional studies/ records that were reviewed personally reviewed by me today include: Nuclear stress test 05/01/2012: Overall Impression:  Normal stress nuclear study.  LV Ejection Fraction: 62%.  LV Wall Motion:  NL LV Function; NL Wall Motion  ASSESSMENT AND PLAN: Palpitations Abnormal EKG with nonspecific ST-T wave changes Recommend three-day monitor, echocardiogram.   Current medicines are reviewed at length with the patient today.  The patient does not have concerns regarding medicines.  Labs/ tests ordered today include:  Orders Placed This Encounter  Procedures  . LONG TERM MONITOR (3-14 DAYS)  . EKG 12-Lead  . ECHOCARDIOGRAM COMPLETE    I had a lengthy and detailed discussion with the patient regarding diagnoses, prognosis, diagnostic options.  Disposition:   FU with Cardiology after tests   Thank you for this consultation. We will forwarding this consultation to referring physician.     Signed, Wende Bushy, MD  08/16/2016 2:00 PM    [place]  This note was generated in part with voice recognition software and I apologize for any typographical errors that were not detected and corrected.

## 2016-08-16 NOTE — Patient Instructions (Addendum)
Testing/Procedures: Your physician has requested that you have an echocardiogram. Echocardiography is a painless test that uses sound waves to create images of your heart. It provides your doctor with information about the size and shape of your heart and how well your heart's chambers and valves are working. This procedure takes approximately one hour. There are no restrictions for this procedure.  Your physician has recommended that you wear an event monitor. Event monitors are medical devices that record the heart's electrical activity. Doctors most often [place] these monitors to diagnose arrhythmias. Arrhythmias are problems with the speed or rhythm of the heartbeat. The monitor is a small, portable device. You can wear one while you do your normal daily activities. This is usually used to diagnose what is causing palpitations/syncope (passing out).    Follow-Up: Your physician recommends that you schedule a follow-up appointment as needed. We will call you with results and if needed schedule follow up at that time.    It was a pleasure seeing you today here in the office. Please do not hesitate to give [place] a call back if you have any further questions. [place], BSN     Echocardiogram An echocardiogram, or echocardiography, uses sound waves (ultrasound) to produce an image of your heart. The echocardiogram is simple, painless, obtained within a short period of time, and offers valuable information to your health care provider. The images from an echocardiogram can provide information such as:  Evidence of coronary artery disease (CAD).  Heart size.  Heart muscle function.  Heart valve function.  Aneurysm detection.  Evidence of a past heart attack.  Fluid buildup around the heart.  Heart muscle thickening.  Assess heart valve function. Tell a health care provider about:  Any allergies you have.  All medicines you are taking, including vitamins, herbs, eye  drops, creams, and over-the-counter medicines.  Any problems you or family members have had with anesthetic medicines.  Any blood disorders you have.  Any surgeries you have had.  Any medical conditions you have.  Whether you are pregnant or may be pregnant. What happens before the procedure? No special preparation is needed. Eat and drink normally. What happens during the procedure?  In order to produce an image of your heart, gel will be applied to your chest and a wand-like tool (transducer) will be moved over your chest. The gel will help transmit the sound waves from the transducer. The sound waves will harmlessly bounce off your heart to allow the heart images to be captured in real-time motion. These images will then be recorded.  You may need an IV to receive a medicine that improves the quality of the pictures. What happens after the procedure? You may return to your normal schedule including diet, activities, and medicines, unless your health care provider tells you otherwise. This information is not intended to replace advice given to you by your health care provider. Make sure you discuss any questions you have with your health care provider. Document Released: 04/20/2000 Document Revised: 12/10/2015 Document Reviewed: 12/29/2012 Elsevier Interactive Patient Education  2017 [employer].  Cardiac Event Monitoring A cardiac event monitor is a small recording device that is used to detect abnormal heart rhythms (arrhythmias). The monitor is used to record your heart rhythm when you have symptoms, such as:  Fast heartbeats (palpitations), such as heart racing or fluttering.  Dizziness.  Fainting or light-headedness.  Unexplained weakness. Some monitors are wired to electrodes placed on your chest. Electrodes are  flat, sticky disks that attach to your skin. Other monitors may be hand-held or worn on the wrist. The monitor can be worn for up to 30 days. If the monitor is  attached to your chest, a technician will prepare your chest for the electrode placement and show you how to work the monitor. Take time to practice using the monitor before you leave the office. Make sure you understand how to send the information from the monitor to your health care provider. In some cases, you may need to use a landline telephone instead of a cell phone. What are the risks? Generally, this device is safe to use, but it possible that the skin under the electrodes will become irritated. How to use your cardiac event monitor  Wear your monitor at all times, except when you are in water:  Do not let the monitor get wet.  Take the monitor off when you bathe. Do not swim or use a hot tub with it on.  Keep your skin clean. Do not put body lotion or moisturizer on your chest.  Change the electrodes as told by your health care provider or any time they stop sticking to your skin. You may need to use medical tape to keep them on.  Try to put the electrodes in slightly different places on your chest to help prevent skin irritation. They must remain in the area under your left breast and in the upper right section of your chest.  Make sure the monitor is safely clipped to your clothing or in a location close to your body that your health care provider recommends.  Press the button to record as soon as you feel heart-related symptoms, such as:  Dizziness.  Weakness.  Light-headedness.  Palpitations.  Thumping or pounding in your chest.  Shortness of breath.  Unexplained weakness.  Keep a diary of your activities, such as walking, doing chores, and taking medicine. It is very important to note what you were doing when you pushed the button to record your symptoms. This will help your health care provider determine what might be contributing to your symptoms.  Send the recorded information as recommended by your health care provider. It may take some time for your health  care provider to process the results.  Change the batteries as told by your health care provider.  Keep electronic devices away from your monitor. This includes:  Tablets.  MP3 players.  Cell phones.  While wearing your monitor you should avoid:  Electric blankets.  Armed forces operational officer.  Electric toothbrushes.  Microwave ovens.  Magnets.  Metal detectors. Get help right away if:  You have chest pain.  You have extreme difficulty breathing or shortness of breath.  You develop a very fast heartbeat that persists.  You develop dizziness that does not go away.  You faint or constantly feel like you are about to faint. Summary  A cardiac event monitor is a small recording device that is used to help detect abnormal heart rhythms (arrhythmias).  The monitor is used to record your heart rhythm when you have heart-related symptoms.  Make sure you understand how to send the information from the monitor to your health care provider.  It is important to press the button on the monitor when you have any heart-related symptoms.  Keep a diary of your activities, such as walking, doing chores, and taking medicine. It is very important to note what you were doing when you pushed the button to record your symptoms.  This will help your health care provider learn what might be causing your symptoms. This information is not intended to replace advice given to you by your health care provider. Make sure you discuss any questions you have with your health care provider. Document Released: 01/31/2008 Document Revised: 04/07/2016 Document Reviewed: 04/07/2016 Elsevier Interactive Patient Education  2017 [employer].

## 2016-08-20 ENCOUNTER — Ambulatory Visit: Payer: 59 | Admitting: Cardiovascular Disease

## 2016-08-20 DIAGNOSIS — R002 Palpitations: Secondary | ICD-10-CM | POA: Diagnosis not present

## 2016-08-23 DIAGNOSIS — R002 Palpitations: Secondary | ICD-10-CM | POA: Diagnosis not present

## 2016-08-24 ENCOUNTER — Other Ambulatory Visit: Payer: 59

## 2016-08-24 ENCOUNTER — Telehealth: Payer: Self-pay | Admitting: *Deleted

## 2016-08-24 DIAGNOSIS — R9431 Abnormal electrocardiogram [ECG] [EKG]: Secondary | ICD-10-CM

## 2016-08-24 NOTE — Telephone Encounter (Signed)
Spoke with patient in detail about monitor results and recommendations. She works at [employer] and states that she can have those labs done there today. Let her know that I would call with results of both echo and labs as they come available. She verbalized understanding of our conversation, agreement with plan, and had no further questions at this time.

## 2016-08-24 NOTE — Telephone Encounter (Signed)
-----   Message from Wende Bushy, MD sent at 08/23/2016  6:03 PM EDT ----- Overall sinus. Her symptoms corresponded to sinus rhythm with occasional PVCs, ventricular bigeminy and ventricular trigeminy. Overall, these were not sustained nor long-lasting. Indication for treatment would be significant symptoms associated with PVCs, otherwise she may just experience side effects with treatment. We can do CBC, BMP, TSH/free T4 if not done recently. Will await EF on echocardiogram.

## 2016-08-28 ENCOUNTER — Other Ambulatory Visit (INDEPENDENT_AMBULATORY_CARE_PROVIDER_SITE_OTHER): Payer: 59

## 2016-08-28 DIAGNOSIS — R9431 Abnormal electrocardiogram [ECG] [EKG]: Secondary | ICD-10-CM

## 2016-08-28 LAB — BASIC METABOLIC PANEL
BUN: 14 mg/dL (ref 6–23)
CALCIUM: 9.5 mg/dL (ref 8.4–10.5)
CO2: 29 meq/L (ref 19–32)
CREATININE: 0.92 mg/dL (ref 0.40–1.20)
Chloride: 106 mEq/L (ref 96–112)
GFR: 67.64 mL/min (ref >60.00)
GLUCOSE: 117 mg/dL — AB (ref 70–99)
Potassium: 3.6 mEq/L (ref 3.5–5.1)
Sodium: 141 mEq/L (ref 135–145)

## 2016-08-28 LAB — CBC WITH DIFFERENTIAL/PLATELET
BASOS PCT: 2 % (ref 0.0–3.0)
Basophils Absolute: 0.1 10*3/uL (ref 0.0–0.1)
EOS PCT: 6.5 % — AB (ref 0.0–5.0)
Eosinophils Absolute: 0.2 10*3/uL (ref 0.0–0.7)
HEMATOCRIT: 42.4 % (ref 36.0–46.0)
HEMOGLOBIN: 14.1 g/dL (ref 12.0–15.0)
LYMPHS PCT: 41.2 % (ref 12.0–46.0)
Lymphs Abs: 1.5 10*3/uL (ref 0.7–4.0)
MCHC: 33.3 g/dL (ref 30.0–36.0)
MCV: 90.2 fl (ref 78.0–100.0)
MONO ABS: 0.2 10*3/uL (ref 0.1–1.0)
MONOS PCT: 5.1 % (ref 3.0–12.0)
Neutro Abs: 1.7 10*3/uL (ref 1.4–7.7)
Neutrophils Relative %: 45.2 % (ref 43.0–77.0)
Platelets: 232 10*3/uL (ref 150.0–400.0)
RBC: 4.7 Mil/uL (ref 3.87–5.11)
RDW: 13 % (ref 11.5–15.5)
WBC: 3.7 10*3/uL — AB (ref 4.0–10.5)

## 2016-08-28 LAB — TSH: TSH: 3.1 u[IU]/mL (ref 0.35–4.50)

## 2016-08-28 LAB — T4, FREE: Free T4: 0.72 ng/dL (ref 0.60–1.60)

## 2016-09-07 ENCOUNTER — Ambulatory Visit (INDEPENDENT_AMBULATORY_CARE_PROVIDER_SITE_OTHER): Payer: 59 | Admitting: Family Medicine

## 2016-09-07 ENCOUNTER — Encounter: Payer: Self-pay | Admitting: Family Medicine

## 2016-09-07 DIAGNOSIS — R002 Palpitations: Secondary | ICD-10-CM

## 2016-09-07 NOTE — Progress Notes (Signed)
   Subjective:    Patient ID: [name], female    DOB: [birth year], 56 y.o.   MRN: [id number]  HPI   56 year old female presents for 6 month follow up palpitations. And for review of labs done with cardiology.  Palpitations: She continues to have heart fast rate, heavy pounding. Lasts seconds. 7 times day. Ongoing in last 2 days.  She feels tired when they are happening. No SOB, no CP, no dizziness.  Cardiology: Told to stop caffeine, antihistamine, ETOh. Drinking a lot of water.  Holter Monitor:  Reviewed. Has upcoming ECHO   CBC showed slightly low wbc, specific counts normal. No concern for HIV. TSH nml Glucose was 117 but she was not fasting. Review of Systems  Constitutional: Negative for fatigue and fever.  HENT: Negative for ear pain.   Eyes: Negative for pain.  Respiratory: Negative for chest tightness and shortness of breath.   Cardiovascular: Positive for palpitations. Negative for chest pain and leg swelling.  Gastrointestinal: Negative for abdominal pain.  Genitourinary: Negative for dysuria.       Objective:   Physical Exam  Constitutional: Vital signs are normal. She appears well-developed and well-nourished. She is cooperative.  Non-toxic appearance. She does not appear ill. No distress.  HENT:  Head: Normocephalic.  Right Ear: Hearing, tympanic membrane, external ear and ear canal normal. Tympanic membrane is not erythematous, not retracted and not bulging.  Left Ear: Hearing, tympanic membrane, external ear and ear canal normal. Tympanic membrane is not erythematous, not retracted and not bulging.  Nose: No mucosal edema or rhinorrhea. Right sinus exhibits no maxillary sinus tenderness and no frontal sinus tenderness. Left sinus exhibits no maxillary sinus tenderness and no frontal sinus tenderness.  Mouth/Throat: Uvula is midline, oropharynx is clear and moist and mucous membranes are normal.  Eyes: Conjunctivae, EOM and lids are normal. Pupils are equal,  round, and reactive to light. Lids are everted and swept, no foreign bodies found.  Neck: Trachea normal and normal range of motion. Neck supple. Carotid bruit is not present. No thyroid mass and no thyromegaly present.  Cardiovascular: Normal rate, regular rhythm, S1 normal, S2 normal, normal heart sounds, intact distal pulses and normal pulses.  Exam reveals no gallop and no friction rub.   No murmur heard. Pulmonary/Chest: Effort normal and breath sounds normal. No tachypnea. No respiratory distress. She has no decreased breath sounds. She has no wheezes. She has no rhonchi. She has no rales.  Abdominal: Soft. Normal appearance and bowel sounds are normal. There is no tenderness.  Neurological: She is alert.  Skin: Skin is warm, dry and intact. No rash noted.  Psychiatric: Her speech is normal and behavior is normal. Judgment and thought content normal. Her mood appears not anxious. Cognition and memory are normal. She does not exhibit a depressed mood.          Assessment & Plan:

## 2016-09-07 NOTE — Assessment & Plan Note (Signed)
Upcoming eval. Pt working on decrease of ETOH, caffeine, etc. She is not symptomatic enough at this time to warrant starting BBLocker, but she will let us or Dr. Yvone Neu know if interested. Lab eval reviewed and not concerning. Glucose normal  nonfasting.  WBC only slight low all specific counts normal except eosinophils high in response to pt current allergies.

## 2016-09-07 NOTE — Patient Instructions (Addendum)
Return for fasting lipids and hepatic panel when able to.

## 2016-09-07 NOTE — Progress Notes (Signed)
Pre visit review using our clinic review tool, if applicable. No additional management support is needed unless otherwise documented below in the visit note. 

## 2016-09-18 ENCOUNTER — Other Ambulatory Visit: Payer: Self-pay

## 2016-09-18 ENCOUNTER — Ambulatory Visit (INDEPENDENT_AMBULATORY_CARE_PROVIDER_SITE_OTHER): Payer: 59

## 2016-09-18 DIAGNOSIS — R002 Palpitations: Secondary | ICD-10-CM

## 2016-09-20 ENCOUNTER — Telehealth: Payer: Self-pay | Admitting: Cardiology

## 2016-09-20 NOTE — Telephone Encounter (Signed)
Patient returning call for echo results please call work number

## 2016-09-20 NOTE — Telephone Encounter (Signed)
See results note. 

## 2016-10-12 DIAGNOSIS — B360 Pityriasis versicolor: Secondary | ICD-10-CM | POA: Diagnosis not present

## 2016-10-12 DIAGNOSIS — D225 Melanocytic nevi of trunk: Secondary | ICD-10-CM | POA: Diagnosis not present

## 2016-10-12 DIAGNOSIS — L309 Dermatitis, unspecified: Secondary | ICD-10-CM | POA: Diagnosis not present

## 2016-11-21 ENCOUNTER — Encounter: Payer: Self-pay | Admitting: Family Medicine

## 2016-11-21 ENCOUNTER — Ambulatory Visit (INDEPENDENT_AMBULATORY_CARE_PROVIDER_SITE_OTHER)
Admission: RE | Admit: 2016-11-21 | Discharge: 2016-11-21 | Disposition: A | Discharge status: Home / Self Care | Payer: 59 | Source: Ambulatory Visit | Attending: Family Medicine | Admitting: Family Medicine

## 2016-11-21 ENCOUNTER — Ambulatory Visit (INDEPENDENT_AMBULATORY_CARE_PROVIDER_SITE_OTHER): Payer: 59 | Admitting: Family Medicine

## 2016-11-21 VITALS — BP 100/60 | HR 89 | Temp 97.8°F | Ht 66.25 in | Wt 164.8 lb

## 2016-11-21 DIAGNOSIS — M79671 Pain in right foot: Secondary | ICD-10-CM

## 2016-11-21 DIAGNOSIS — S99921A Unspecified injury of right foot, initial encounter: Secondary | ICD-10-CM | POA: Diagnosis not present

## 2016-11-21 NOTE — Progress Notes (Signed)
Dr. Frederico Hamman T. Milianna Ericsson, MD, [facility] [place], [address] Phone: [phone] Fax: [phone]  11/21/2016  Patient: [name], MRN: [id number], DOB: 1961/04/29, 56 y.o.  Primary Physician:  Jinny Sanders, MD   Chief Complaint  Patient presents with  . Foot Pain    Right-Fell on foot while on vacaton last week   Subjective:   [name] is a 56 y.o. very pleasant female patient who presents with the following:  Approximately one week and will go the patient fell on her right foot and forcefully plantar flex her foot and landed with a lot of weight on her forefoot and midfoot on the top. She is always been able to bear weight and she did have some difficulty for 3 or 4 days. Initially she wore more the stiff soled shoe and had less problems walking.  Past Medical History, Surgical History, Social History, Family History, Problem List, Medications, and Allergies have been reviewed and updated if relevant.  Patient Active Problem List   Diagnosis Date Noted  . External hemorrhoids without complication 54/62/7035  . MDD (recurrent major depressive disorder) in remission ([place]) 08/27/2014  . History of anxiety 08/27/2014  . Insomnia 08/27/2014  . Palpitations 03/17/2014  . Uterine fibroid 10/06/2010  . ALLERGIC RHINITIS 07/07/2008  . ABNORMAL PAP SMEAR 02/28/2007  . GASTROINTESTINAL HEMORRHAGE, HX OF 02/28/2007    Past Medical History:  Diagnosis Date  . Abnormal glandular Papanicolaou smear of cervix   . Hemorrhage    gastrointestinal, associated with GI infection  . History of cervical biopsy 1986   hemorrhage, negative biopsy  . NSVD (normal spontaneous vaginal delivery)      X 3    Past Surgical History:  Procedure Laterality Date  . COLONOSCOPY    . TUBAL LIGATION    . WISDOM TOOTH EXTRACTION      Social History   Social History  . Marital status: Married    Spouse name: N/A  . Number of  children: 3  . Years of education: N/A   Occupational History  . medical receptionist    Social History Main Topics  . Smoking status: Former Smoker    Packs/day: 0.25    Years: 5.00    Types: Cigarettes  . Smokeless tobacco: Never Used  . Alcohol use 0.0 oz/week     Comment: socially  . Drug use: No  . Sexual activity: Not on file   Other Topics Concern  . Not on file   Social History Narrative   Regular exercise at [employer], 3-5 days a week.    Diet: fruits and veggies, grilled. Good water intake, only one pepsi a day, minimal calcium.    Family History  Problem Relation Age of Onset  . Hyperlipidemia Father   . Diabetes Maternal Aunt   . Cancer Paternal Grandfather        lung  . Cancer Maternal Aunt        breast, grandmother's sister  . Cancer Maternal Aunt        colon  . Heart disease Neg Hx        no MI's less than 55    Allergies  Allergen Reactions  . Cefuroxime Axetil Hives and Itching  . Ciprofloxacin Hcl Swelling    Swelling of lips    Medication list reviewed and updated in full in [place].  GEN: No fevers, chills. Nontoxic. Primarily MSK c/o today.  MSK: Detailed in the HPI GI: tolerating PO intake without difficulty Neuro: No numbness, parasthesias, or tingling associated. Otherwise the pertinent positives of the ROS are noted above.   Objective:   BP 100/60   Pulse 89   Temp 97.8 F (36.6 C) (Oral)   Ht 5' 6.25" (1.683 m)   Wt 164 lb 12 oz (74.7 kg)   LMP 04/09/2012   BMI 26.39 kg/m    GEN: WDWN, NAD, Non-toxic, Alert & Oriented x 3 HEENT: Atraumatic, Normocephalic.  Ears and Nose: No external deformity. EXTR: No clubbing/cyanosis/edema NEURO: Normal gait.  PSYCH: Normally interactive. Conversant. Not depressed or anxious appearing.  Calm demeanor.    Right foot: Excellent range of motion in all MTPs. Mild tenderness along the second metatarsal shaft. All other metatarsals are nontender. Nontender at the base of the  fifth metatarsal. Nontender at the navicular and nontender at the cuboid. Nontender at the talus. Nontender at the calcaneus. Mild tender the dorsum of the midfoot. atfl is intact and stable.  Radiology: No results found.   Assessment and Plan:   Right foot pain - Plan: DG Foot Complete Right  X-rays appear grossly unremarkable with no fractures.  Second metatarsal shaft bone contusion with midfoot sprain. Recommend a stiff shoe than what she is wearing currently particularly in the midfoot and forefoot. I expect that she will do quite well.  Follow-up: No Follow-up on file.  Meds ordered this encounter  Medications  . fluocinonide cream (LIDEX) 0.05 %    Sig: APPLY TO AFFECTED AREA TWICE A DAY    Refill:  2  . ketoconazole (NIZORAL) 2 % shampoo    Sig: USE 3 TIMES WEEKLY, LATHER ON SCALP, LEAVE ON 8-10 MINS, RINSE OUT    Refill:  0   There are no discontinued medications. Orders Placed This Encounter  Procedures  . DG Foot Complete Right    Signed,  Megann Easterwood T. Philomene Haff, MD   Allergies as of 11/21/2016      Reactions   Cefuroxime Axetil Hives, Itching   Ciprofloxacin Hcl Swelling   Swelling of lips      Medication List       Accurate as of 11/21/16  2:12 PM. Always use your most recent med list.          ALPRAZolam 0.25 MG tablet Commonly known as:  XANAX Take 0.25 mg by mouth as needed for anxiety.   betamethasone dipropionate 0.05 % cream Commonly known as:  DIPROLENE Apply topically 2 (two) times daily.   fluocinonide cream 0.05 % Commonly known as:  LIDEX APPLY TO AFFECTED AREA TWICE A DAY   hydrocortisone-pramoxine 2.5-1 % rectal cream Commonly known as:  ANALPRAM-HC Place 1 application rectally 2 (two) times daily.   IBUPROFEN PO Take by mouth as needed.   ketoconazole 2 % shampoo Commonly known as:  NIZORAL USE 3 TIMES WEEKLY, LATHER ON SCALP, LEAVE ON 8-10 MINS, RINSE OUT

## 2016-12-06 ENCOUNTER — Other Ambulatory Visit: Payer: Self-pay | Admitting: Family Medicine

## 2016-12-06 DIAGNOSIS — Z1231 Encounter for screening mammogram for malignant neoplasm of breast: Secondary | ICD-10-CM

## 2016-12-17 ENCOUNTER — Ambulatory Visit
Admission: RE | Admit: 2016-12-17 | Discharge: 2016-12-17 | Disposition: A | Discharge status: Home / Self Care | Payer: 59 | Source: Ambulatory Visit | Attending: Family Medicine | Admitting: Family Medicine

## 2016-12-17 DIAGNOSIS — Z1231 Encounter for screening mammogram for malignant neoplasm of breast: Secondary | ICD-10-CM

## 2017-01-10 ENCOUNTER — Encounter: Payer: Self-pay | Admitting: Internal Medicine

## 2017-01-10 ENCOUNTER — Ambulatory Visit (INDEPENDENT_AMBULATORY_CARE_PROVIDER_SITE_OTHER): Payer: 59 | Admitting: Internal Medicine

## 2017-01-10 VITALS — BP 108/76 | HR 67 | Temp 98.1°F | Wt 164.8 lb

## 2017-01-10 DIAGNOSIS — S161XXA Strain of muscle, fascia and tendon at neck level, initial encounter: Secondary | ICD-10-CM

## 2017-01-10 MED ORDER — CYCLOBENZAPRINE HCL 5 MG PO TABS: 5.0000 mg | ORAL_TABLET | Freq: Every evening | ORAL | 0 refills | Status: DC | PRN

## 2017-01-10 NOTE — Progress Notes (Signed)
Subjective:    Patient ID: [name], female    DOB: 1960/06/25, 56 y.o.   MRN: [id number]  HPI  Pt presents to the clinic today with c/o right side neck pain. She reports this started yesterday. She woke up with the pain. She thought she may have slept wrong. She toughed out the pain yesterday but had difficulty sleeping last night due to the pain. She woke up at 6 am with neck pain and a headache. She took Ibuprofen at 6 am with minimal relief. She took Ibuprofen again at 10 am with minimal relief. She denies any injury to the area. She denies numbness or tingling down her arms.   Review of Systems      Past Medical History:  Diagnosis Date  . Abnormal glandular Papanicolaou smear of cervix   . Hemorrhage    gastrointestinal, associated with GI infection  . History of cervical biopsy 1986   hemorrhage, negative biopsy  . NSVD (normal spontaneous vaginal delivery)      X 3    Current Outpatient Prescriptions  Medication Sig Dispense Refill  . ALPRAZolam (XANAX) 0.25 MG tablet Take 0.25 mg by mouth as needed for anxiety.    . betamethasone dipropionate (DIPROLENE) 0.05 % cream Apply topically 2 (two) times daily. 30 g 0  . fluocinonide cream (LIDEX) 0.05 % APPLY TO AFFECTED AREA TWICE A DAY  2  . hydrocortisone-pramoxine (ANALPRAM-HC) 2.5-1 % rectal cream Place 1 application rectally 2 (two) times daily. 30 g 0  . IBUPROFEN PO Take by mouth as needed.    Marland Kitchen ketoconazole (NIZORAL) 2 % shampoo USE 3 TIMES WEEKLY, LATHER ON SCALP, LEAVE ON 8-10 MINS, RINSE OUT  0   No current facility-administered medications for this visit.     Allergies  Allergen Reactions  . Cefuroxime Axetil Hives and Itching  . Ciprofloxacin Hcl Swelling    Swelling of lips    Family History  Problem Relation Age of Onset  . Hyperlipidemia Father   . Diabetes Maternal Aunt   . Cancer Paternal Grandfather        lung  . Cancer Maternal Aunt        breast, grandmother's sister  . Cancer Maternal  Aunt        colon  . Breast cancer Paternal Aunt   . Heart disease Neg Hx        no MI's less than 55    Social History   Social History  . Marital status: Married    Spouse name: N/A  . Number of children: 3  . Years of education: N/A   Occupational History  . medical receptionist    Social History Main Topics  . Smoking status: Former Smoker    Packs/day: 0.25    Years: 5.00    Types: Cigarettes  . Smokeless tobacco: Never Used  . Alcohol use 0.0 oz/week     Comment: socially  . Drug use: No  . Sexual activity: Not on file   Other Topics Concern  . Not on file   Social History Narrative   Regular exercise at [employer], 3-5 days a week.    Diet: fruits and veggies, grilled. Good water intake, only one pepsi a day, minimal calcium.     Constitutional: Pt reports headache. Denies fever, malaise, fatigue, or abrupt weight changes.  HEENT: Denies eye pain, eye redness, ear pain, ringing in the ears, wax buildup, runny nose, nasal congestion, bloody nose, or sore throat. Respiratory: Denies  difficulty breathing, shortness of breath, cough or sputum production.   Musculoskeletal: Pt reports neck pain. Deniesdifficulty with gait, or joint swelling.  Neurological: Denies dizziness, difficulty with memory, difficulty with speech or problems with balance and coordination.    No other specific complaints in a complete review of systems (except as listed in HPI above).  Objective:   Physical Exam   BP 108/76   Pulse 67   Temp 98.1 F (36.7 C) (Oral)   Wt 164 lb 12 oz (74.7 kg)   LMP 04/09/2012   SpO2 99%   BMI 26.39 kg/m  Wt Readings from Last 3 Encounters:  01/10/17 164 lb 12 oz (74.7 kg)  11/21/16 164 lb 12 oz (74.7 kg)  09/07/16 168 lb 12 oz (76.5 kg)    General: Appears her stated age, well developed, well nourished in NAD. Musculoskeletal: Decreased flexion and extension of the cervical spine. Normal rotation. No bony tenderness noted over the cervical spine.  Pain with palpation of the right para cervical muscles. Neurological: Alert and oriented.   BMET    Component Value Date/Time   NA 141 08/28/2016 0859   K 3.6 08/28/2016 0859   CL 106 08/28/2016 0859   CO2 29 08/28/2016 0859   GLUCOSE 117 (H) 08/28/2016 0859   GLUCOSE 90 05/17/2006 1128   BUN 14 08/28/2016 0859   CREATININE 0.92 08/28/2016 0859   CALCIUM 9.5 08/28/2016 0859   GFRNONAA >90 04/23/2012 1538   GFRAA >90 04/23/2012 1538    Lipid Panel     Component Value Date/Time   CHOL 221 (H) 02/13/2016 1615   TRIG 159.0 (H) 02/13/2016 1615   TRIG 48 05/17/2006 1128   HDL 57.70 02/13/2016 1615   CHOLHDL 4 02/13/2016 1615   VLDL 31.8 02/13/2016 1615   LDLCALC 132 (H) 02/13/2016 1615    CBC    Component Value Date/Time   WBC 3.7 (L) 08/28/2016 0859   RBC 4.70 08/28/2016 0859   HGB 14.1 08/28/2016 0859   HCT 42.4 08/28/2016 0859   PLT 232.0 08/28/2016 0859   MCV 90.2 08/28/2016 0859   MCH 30.6 04/23/2012 1538   MCHC 33.3 08/28/2016 0859   RDW 13.0 08/28/2016 0859   LYMPHSABS 1.5 08/28/2016 0859   MONOABS 0.2 08/28/2016 0859   EOSABS 0.2 08/28/2016 0859   BASOSABS 0.1 08/28/2016 0859    Hgb A1C No results found for: HGBA1C         Assessment & Plan:   Muscle Strain of Cervical Region:  Increase Ibuprofen to 800 mg TID with food Heat and massage may be helpful Neck exercises given eRx for Flexeril 5 mg QHS prn  Return precautions discussed Webb Silversmith, NP

## 2017-01-10 NOTE — Patient Instructions (Signed)
Neck Exercises Neck exercises can be important for many reasons:  They can help you to improve and maintain flexibility in your neck. This can be especially important as you age.  They can help to make your neck stronger. This can make movement easier.  They can reduce or prevent neck pain.  They may help your upper back.  Ask your health care provider which neck exercises would be best for you. Exercises Neck Press Repeat this exercise 10 times. Do it first thing in the morning and right before bed or as told by your health care provider. 1. Lie on your back on a firm bed or on the floor with a pillow under your head. 2. Use your neck muscles to push your head down on the pillow and straighten your spine. 3. Hold the position as well as you can. Keep your head facing up and your chin tucked. 4. Slowly count to 5 while holding this position. 5. Relax for a few seconds. Then repeat.  Isometric Strengthening Do a full set of these exercises 2 times a day or as told by your health care provider. 1. Sit in a supportive chair and place your hand on your forehead. 2. Push forward with your head and neck while pushing back with your hand. Hold for 10 seconds. 3. Relax. Then repeat the exercise 3 times. 4. Next, do thesequence again, this time putting your hand against the back of your head. Use your head and neck to push backward against the hand pressure. 5. Finally, do the same exercise on either side of your head, pushing sideways against the pressure of your hand.  Prone Head Lifts Repeat this exercise 5 times. Do this 2 times a day or as told by your health care provider. 1. Lie face-down, resting on your elbows so that your chest and upper back are raised. 2. Start with your head facing downward, near your chest. Position your chin either on or near your chest. 3. Slowly lift your head upward. Lift until you are looking straight ahead. Then continue lifting your head as far back as  you can stretch. 4. Hold your head up for 5 seconds. Then slowly lower it to your starting position.  Supine Head Lifts Repeat this exercise 8-10 times. Do this 2 times a day or as told by your health care provider. 1. Lie on your back, bending your knees to point to the ceiling and keeping your feet flat on the floor. 2. Lift your head slowly off the floor, raising your chin toward your chest. 3. Hold for 5 seconds. 4. Relax and repeat.  Scapular Retraction Repeat this exercise 5 times. Do this 2 times a day or as told by your health care provider. 1. Stand with your arms at your sides. Look straight ahead. 2. Slowly pull both shoulders backward and downward until you feel a stretch between your shoulder blades in your upper back. 3. Hold for 10-30 seconds. 4. Relax and repeat.  Contact a health care provider if:  Your neck pain or discomfort gets much worse when you do an exercise.  Your neck pain or discomfort does not improve within 2 hours after you exercise. If you have any of these problems, stop exercising right away. Do not do the exercises again unless your health care provider says that you can. Get help right away if:  You develop sudden, severe neck pain. If this happens, stop exercising right away. Do not do the exercises again unless your   health care provider says that you can. Exercises Neck Stretch  Repeat this exercise 3-5 times. 1. Do this exercise while standing or while sitting in a chair. 2. Place your feet flat on the floor, shoulder-width apart. 3. Slowly turn your head to the right. Turn it all the way to the right so you can look over your right shoulder. Do not tilt or tip your head. 4. Hold this position for 10-30 seconds. 5. Slowly turn your head to the left, to look over your left shoulder. 6. Hold this position for 10-30 seconds.  Neck Retraction Repeat this exercise 8-10 times. Do this 3-4 times a day or as told by your health care  provider. 1. Do this exercise while standing or while sitting in a sturdy chair. 2. Look straight ahead. Do not bend your neck. 3. Use your fingers to push your chin backward. Do not bend your neck for this movement. Continue to face straight ahead. If you are doing the exercise properly, you will feel a slight sensation in your throat and a stretch at the back of your neck. 4. Hold the stretch for 1-2 seconds. Relax and repeat.  This information is not intended to replace advice given to you by your health care provider. Make sure you discuss any questions you have with your health care provider. Document Released: 04/04/2015 Document Revised: 09/29/2015 Document Reviewed: 11/01/2014 Elsevier Interactive Patient Education  2017 [employer]  

## 2017-01-26 ENCOUNTER — Other Ambulatory Visit: Payer: Self-pay | Admitting: Internal Medicine

## 2017-01-28 NOTE — Telephone Encounter (Signed)
Please advise if okay to refill... You saw her 01/10/17 acute for neck strain

## 2017-01-28 NOTE — Telephone Encounter (Signed)
Is she still having neck pain?

## 2017-02-13 ENCOUNTER — Telehealth: Payer: Self-pay | Admitting: Family Medicine

## 2017-02-13 DIAGNOSIS — H5203 Hypermetropia, bilateral: Secondary | ICD-10-CM | POA: Diagnosis not present

## 2017-02-13 NOTE — Telephone Encounter (Signed)
-----   Message from Ellamae Sia sent at 02/13/2017 10:33 AM EDT ----- Regarding: Lab orders for Wednesday, 10.17.18 Patient is scheduled for CPX labs, please order future labs, Thanks , Karna Christmas

## 2017-02-19 ENCOUNTER — Telehealth: Payer: Self-pay | Admitting: Family Medicine

## 2017-02-19 ENCOUNTER — Encounter: Payer: Self-pay | Admitting: Family Medicine

## 2017-02-19 ENCOUNTER — Other Ambulatory Visit (INDEPENDENT_AMBULATORY_CARE_PROVIDER_SITE_OTHER): Payer: 59

## 2017-02-19 ENCOUNTER — Ambulatory Visit (INDEPENDENT_AMBULATORY_CARE_PROVIDER_SITE_OTHER): Payer: 59 | Admitting: Family Medicine

## 2017-02-19 VITALS — BP 115/74 | HR 80 | Temp 97.8°F | Ht 66.75 in | Wt 167.5 lb

## 2017-02-19 DIAGNOSIS — Z0000 Encounter for general adult medical examination without abnormal findings: Secondary | ICD-10-CM

## 2017-02-19 DIAGNOSIS — F3340 Major depressive disorder, recurrent, in remission, unspecified: Secondary | ICD-10-CM | POA: Diagnosis not present

## 2017-02-19 DIAGNOSIS — Z13220 Encounter for screening for lipoid disorders: Secondary | ICD-10-CM

## 2017-02-19 DIAGNOSIS — J301 Allergic rhinitis due to pollen: Secondary | ICD-10-CM

## 2017-02-19 DIAGNOSIS — Z23 Encounter for immunization: Secondary | ICD-10-CM | POA: Diagnosis not present

## 2017-02-19 LAB — COMPREHENSIVE METABOLIC PANEL
ALK PHOS: 79 U/L (ref 39–117)
ALT: 38 U/L — ABNORMAL HIGH (ref 0–35)
AST: 23 U/L (ref 0–37)
Albumin: 4.2 g/dL (ref 3.5–5.2)
BUN: 14 mg/dL (ref 6–23)
CHLORIDE: 103 meq/L (ref 96–112)
CO2: 28 meq/L (ref 19–32)
Calcium: 9.4 mg/dL (ref 8.4–10.5)
Creatinine, Ser: 0.94 mg/dL (ref 0.40–1.20)
GFR: 65.86 mL/min (ref >60.00)
GLUCOSE: 111 mg/dL — AB (ref 70–99)
POTASSIUM: 4.1 meq/L (ref 3.5–5.1)
SODIUM: 139 meq/L (ref 135–145)
TOTAL PROTEIN: 6.8 g/dL (ref 6.0–8.3)
Total Bilirubin: 0.5 mg/dL (ref 0.2–1.2)

## 2017-02-19 LAB — LIPID PANEL
Cholesterol: 197 mg/dL (ref 0–200)
HDL: 51.9 mg/dL (ref >39.00)
LDL Cholesterol: 123 mg/dL — ABNORMAL HIGH (ref 0–99)
NONHDL: 145.16
Total CHOL/HDL Ratio: 4
Triglycerides: 110 mg/dL (ref 0.0–149.0)
VLDL: 22 mg/dL (ref 0.0–40.0)

## 2017-02-19 MED ORDER — BUDESONIDE 32 MCG/ACT NA SUSP: 2.0000 | Freq: Every day | NASAL | 1 refills | Status: DC

## 2017-02-19 NOTE — Telephone Encounter (Signed)
-----   Message from Ellamae Sia sent at 02/13/2017 10:33 AM EDT ----- Regarding: Lab orders for Wednesday, 10.17.18 Patient is scheduled for CPX labs, please order future labs, Thanks , Karna Christmas

## 2017-02-19 NOTE — Progress Notes (Signed)
Subjective:    Patient ID: [name], female    DOB: 04-18-1961, 56 y.o.   MRN: [id number]  HPI  The patient is here for annual wellness exam and preventative care.    MDD, moderate, GAD: No longer on any medication. Well controlled.  PHQ9: 0 Insomnia, chronic: no longer on any medication.   Allergic rhinitis: in past well treated with rhinocort.. She requests a prescription refill of this.  SE to flonase in past.  Cholesterol:  Due for re-eval. Results not back yet. Lab Results  Component Value Date   CHOL 221 (H) 02/13/2016   HDL 57.70 02/13/2016   LDLCALC 132 (H) 02/13/2016   LDLDIRECT 125.7 07/08/2008   TRIG 159.0 (H) 02/13/2016   CHOLHDL 4 02/13/2016   Diet compliance: good Exercise: walking Other complaints:  Blood pressure 115/74, pulse 80, temperature 97.8 F (36.6 C), temperature source Oral, height 5' 6.75" (1.695 m), weight 167 lb 8 oz (76 kg), last menstrual period 04/09/2012, SpO2 98 %. Body mass index is 26.43 kg/m.   Review of Systems  Constitutional: Negative for fatigue and fever.  HENT: Negative for congestion.   Eyes: Negative for pain.  Respiratory: Negative for cough and shortness of breath.   Cardiovascular: Negative for chest pain, palpitations and leg swelling.  Gastrointestinal: Negative for abdominal pain.  Genitourinary: Negative for dysuria and vaginal bleeding.  Musculoskeletal: Negative for back pain.  Neurological: Negative for syncope, light-headedness and headaches.  Psychiatric/Behavioral: Negative for dysphoric mood.       Objective:   Physical Exam  Constitutional: Vital signs are normal. She appears well-developed and well-nourished. She is cooperative.  Non-toxic appearance. She does not appear ill. No distress.  HENT:  Head: Normocephalic.  Right Ear: Hearing, tympanic membrane, external ear and ear canal normal.  Left Ear: Hearing, tympanic membrane, external ear and ear canal normal.  Nose: Nose normal.  Eyes:  Pupils are equal, round, and reactive to light. Conjunctivae, EOM and lids are normal. Lids are everted and swept, no foreign bodies found.  Neck: Trachea normal and normal range of motion. Neck supple. Carotid bruit is not present. No thyroid mass and no thyromegaly present.  Cardiovascular: Normal rate, regular rhythm, S1 normal, S2 normal, normal heart sounds and intact distal pulses.  Exam reveals no gallop.   No murmur heard. Pulmonary/Chest: Effort normal and breath sounds normal. No respiratory distress. She has no wheezes. She has no rhonchi. She has no rales.  Abdominal: Soft. Normal appearance and bowel sounds are normal. She exhibits no distension, no fluid wave, no abdominal bruit and no mass. There is no hepatosplenomegaly. There is no tenderness. There is no rebound, no guarding and no CVA tenderness. No hernia.  Lymphadenopathy:    She has no cervical adenopathy.    She has no axillary adenopathy.  Neurological: She is alert. She has normal strength. No cranial nerve deficit or sensory deficit.  Skin: Skin is warm, dry and intact. No rash noted.  Psychiatric: Her speech is normal and behavior is normal. Judgment normal. Her mood appears not anxious. Cognition and memory are normal. She does not exhibit a depressed mood.          Assessment & Plan:  The patient's preventative maintenance and recommended screening tests for an annual wellness exam were reviewed in full today. Brought up to date unless services declined.  Counselled on the importance of diet, exercise, and its role in overall health and mortality. The patient's FH and SH was reviewed,  including their home life, tobacco status, and drug and alcohol status.   Vaccines: flu at work,  Tdap  Given.  PAP/DVE: 02/2016 nml and no HPV  MAMMO: 12/2016 low risk  Colon:04/2010  Brodie,repeat in 10 years Hep C neg. Nonsmoker  STD screen: refused

## 2017-02-19 NOTE — Patient Instructions (Signed)
Start rhinicort for allergies.

## 2017-02-20 ENCOUNTER — Other Ambulatory Visit: Payer: 59

## 2017-02-22 ENCOUNTER — Encounter: Payer: 59 | Admitting: Family Medicine

## 2017-02-26 ENCOUNTER — Telehealth: Payer: Self-pay | Admitting: Family Medicine

## 2017-02-26 NOTE — Telephone Encounter (Signed)
-----   Message from Ellamae Sia sent at 02/18/2017  4:07 PM EDT ----- Regarding: Lab orders for Tuesday, 10.23.18 Patient is scheduled for CPX labs, please order future labs, Thanks , Karna Christmas

## 2017-02-28 NOTE — Assessment & Plan Note (Signed)
Resolved.. Pt doing well off medication.

## 2017-02-28 NOTE — Assessment & Plan Note (Signed)
Good control on nasal steroid spray.

## 2017-03-26 ENCOUNTER — Other Ambulatory Visit: Payer: Self-pay | Admitting: *Deleted

## 2017-03-26 MED ORDER — BUDESONIDE 32 MCG/ACT NA SUSP: 2.0000 | Freq: Every day | NASAL | 1 refills | Status: DC

## 2017-03-26 NOTE — Telephone Encounter (Signed)
[name] did not have Rhinocort in stock.  [name] ask that I resend Rx to [facility] in [place].

## 2017-04-19 ENCOUNTER — Encounter: Payer: Self-pay | Admitting: Family Medicine

## 2017-04-19 ENCOUNTER — Ambulatory Visit (INDEPENDENT_AMBULATORY_CARE_PROVIDER_SITE_OTHER): Payer: 59 | Admitting: Family Medicine

## 2017-04-19 VITALS — BP 120/76 | HR 68 | Temp 98.4°F | Ht 66.75 in | Wt 166.8 lb

## 2017-04-19 DIAGNOSIS — R002 Palpitations: Secondary | ICD-10-CM | POA: Diagnosis not present

## 2017-04-19 DIAGNOSIS — K219 Gastro-esophageal reflux disease without esophagitis: Secondary | ICD-10-CM | POA: Diagnosis not present

## 2017-04-19 NOTE — Patient Instructions (Addendum)
Stop all caffeine.  Start prevacid.. For 2-4 weeks  Avoid triggers of reflux.[place], alcohol, caffeine, citris, tomatos, spicy food.  Call if palpitations causing symtpoms like fatigue or dizziness.

## 2017-04-19 NOTE — Progress Notes (Signed)
   Subjective:    Patient ID: [name], female    DOB: 10-28-1960, 56 y.o.   MRN: [id number]  HPI   56 year old female presents with new onset  Indigestion, reflux.  Constant burning and pressure in chest..  Burping up acid.  No difficulty swallowing.   Had been drinking more caffeine lately.  In last month after thanksgiving  She has been using prevacid in last  Week.Marland Kitchen Has now had some improvement in reflux.   No abdominal pain. No ETOH, no  NSAIDs.   Recurrence of palpitations  Off on an at same time as GERD. Had not felt since April   Cardiologist Dr. Yvone Neu  Earlier in year.. holter monitor:  Her symptoms corresponded to sinus rhythm with occasional PVCs, ventricular bigeminy and ventricular trigeminy. Overall, these were not sustained nor long-lasting. Indication for treatment would be significant symptoms associated with PVCs, otherwise she may just experience side effects with treatment. ECHO: LV Ejection Fraction: 62%. LV Wall Motion: NL LV Function; NL Wall Motion Review of Systems  Constitutional: Negative for fatigue and fever.  HENT: Negative for ear pain.   Eyes: Negative for pain.  Respiratory: Negative for chest tightness and shortness of breath.   Cardiovascular: Positive for chest pain and palpitations. Negative for leg swelling.  Gastrointestinal: Negative for abdominal pain.  Genitourinary: Negative for dysuria.       Objective:   Physical Exam  Constitutional: Vital signs are normal. She appears well-developed and well-nourished. She is cooperative.  Non-toxic appearance. She does not appear ill. No distress.  HENT:  Head: Normocephalic.  Right Ear: Hearing, tympanic membrane, external ear and ear canal normal. Tympanic membrane is not erythematous, not retracted and not bulging.  Left Ear: Hearing, tympanic membrane, external ear and ear canal normal. Tympanic membrane is not erythematous, not retracted and not bulging.  Nose: No mucosal edema or  rhinorrhea. Right sinus exhibits no maxillary sinus tenderness and no frontal sinus tenderness. Left sinus exhibits no maxillary sinus tenderness and no frontal sinus tenderness.  Mouth/Throat: Uvula is midline, oropharynx is clear and moist and mucous membranes are normal.  Eyes: Conjunctivae, EOM and lids are normal. Pupils are equal, round, and reactive to light. Lids are everted and swept, no foreign bodies found.  Neck: Trachea normal and normal range of motion. Neck supple. Carotid bruit is not present. No thyroid mass and no thyromegaly present.  Cardiovascular: Normal rate, regular rhythm, S1 normal, S2 normal, normal heart sounds, intact distal pulses and normal pulses. Exam reveals no gallop and no friction rub.  No murmur heard. Pulmonary/Chest: Effort normal and breath sounds normal. No tachypnea. No respiratory distress. She has no decreased breath sounds. She has no wheezes. She has no rhonchi. She has no rales.  Abdominal: Soft. Normal appearance and bowel sounds are normal. There is no tenderness.  Neurological: She is alert.  Skin: Skin is warm, dry and intact. No rash noted.  Psychiatric: Her speech is normal and behavior is normal. Judgment and thought content normal. Her mood appears not anxious. Cognition and memory are normal. She does not exhibit a depressed mood.          Assessment & Plan:

## 2017-04-23 ENCOUNTER — Ambulatory Visit: Payer: 59 | Admitting: Family Medicine

## 2017-04-26 NOTE — Assessment & Plan Note (Signed)
Stop all caffeine.  Treat GERD.  Call if palpitations causing symtpoms like fatigue or dizziness.

## 2017-04-26 NOTE — Assessment & Plan Note (Signed)
Continue PPI, avoid triggers.

## 2017-05-06 ENCOUNTER — Ambulatory Visit (INDEPENDENT_AMBULATORY_CARE_PROVIDER_SITE_OTHER): Payer: 59 | Admitting: Family Medicine

## 2017-05-06 ENCOUNTER — Encounter: Payer: Self-pay | Admitting: Family Medicine

## 2017-05-06 ENCOUNTER — Other Ambulatory Visit: Payer: Self-pay

## 2017-05-06 VITALS — BP 110/70 | HR 72 | Temp 98.4°F | Ht 66.75 in | Wt 167.0 lb

## 2017-05-06 DIAGNOSIS — H6983 Other specified disorders of Eustachian tube, bilateral: Secondary | ICD-10-CM

## 2017-05-06 DIAGNOSIS — J069 Acute upper respiratory infection, unspecified: Secondary | ICD-10-CM | POA: Diagnosis not present

## 2017-05-06 MED ORDER — HYDROCODONE-HOMATROPINE 5-1.5 MG/5ML PO SYRP: ORAL_SOLUTION | ORAL | 0 refills | Status: DC

## 2017-05-06 NOTE — Progress Notes (Signed)
Dr. Frederico Hamman T. Sutter Ahlgren, MD, [facility] [place], [address] Phone: [phone] Fax: [phone]  05/06/2017  Patient: [name], MRN: [id number], DOB: [birth year], 56 y.o.  Primary Physician:  Jinny Sanders, MD   Chief Complaint  Patient presents with  . Cough  . Ear Pain   Subjective:   [name] presents with runny nose, sneezing, cough, sore throat, malaise and minimal / low-grade fever .   Has a bad cough and hurts really bad. Quit tob 30 years ago.   ? recent exposure to others with similar symptoms.   The patent denies sore throat as the primary complaint. Denies sthortness of breath/wheezing, high fever, chest pain, rhinits for more than 14 days, significant myalgia, otalgia, facial pain, abdominal pain, changes in bowel or bladder.  PMH, PHS, Allergies, Problem List, Medications, Family History, and Social History have all been reviewed.  Patient Active Problem List   Diagnosis Date Noted  . GERD (gastroesophageal reflux disease) 04/19/2017  . External hemorrhoids without complication 62/37/6283  . MDD (recurrent major depressive disorder) in remission ([place]) 08/27/2014  . History of anxiety 08/27/2014  . Insomnia 08/27/2014  . Palpitations 03/17/2014  . Uterine fibroid 10/06/2010  . Allergic rhinitis 07/07/2008  . ABNORMAL PAP SMEAR 02/28/2007  . GASTROINTESTINAL HEMORRHAGE, HX OF 02/28/2007    Past Medical History:  Diagnosis Date  . Abnormal glandular Papanicolaou smear of cervix   . Hemorrhage    gastrointestinal, associated with GI infection  . History of cervical biopsy 1986   hemorrhage, negative biopsy  . NSVD (normal spontaneous vaginal delivery)      X 3    Past Surgical History:  Procedure Laterality Date  . COLONOSCOPY    . TUBAL LIGATION    . WISDOM TOOTH EXTRACTION      Social History   Socioeconomic History  . Marital status: Married    Spouse  name: Not on file  . Number of children: 3  . Years of education: Not on file  . Highest education level: Not on file  Social Needs  . Financial resource strain: Not on file  . Food insecurity - worry: Not on file  . Food insecurity - inability: Not on file  . Transportation needs - medical: Not on file  . Transportation needs - non-medical: Not on file  Occupational History  . Occupation: medical receptionist  Tobacco Use  . Smoking status: Former Smoker    Packs/day: 0.25    Years: 5.00    Pack years: 1.25    Types: Cigarettes  . Smokeless tobacco: Never Used  Substance and Sexual Activity  . Alcohol use: Yes    Alcohol/week: 0.0 oz    Comment: socially  . Drug use: No  . Sexual activity: Not on file  Other Topics Concern  . Not on file  Social History Narrative   Regular exercise at [employer], 3-5 days a week.    Diet: fruits and veggies, grilled. Good water intake, only one pepsi a day, minimal calcium.    Family History  Problem Relation Age of Onset  . Hyperlipidemia Father   . Diabetes Maternal Aunt   . Cancer Paternal Grandfather        lung  . Cancer Maternal Aunt        breast, grandmother's sister  . Cancer Maternal Aunt        colon  . Breast cancer Paternal Aunt   .  Heart disease Neg Hx        no MI's less than 55    Allergies  Allergen Reactions  . Cefuroxime Axetil Hives and Itching  . Ciprofloxacin Hcl Swelling    Swelling of lips    Medication list reviewed and updated in full in [place].  ROS as above, eating and drinking - tolerating PO. Urinating normally. No excessive vomitting or diarrhea. O/w as above.  Objective:   Blood pressure 110/70, pulse 72, temperature 98.4 F (36.9 C), temperature source Oral, height 5' 6.75" (1.695 m), weight 167 lb (75.8 kg), last menstrual period 04/09/2012, SpO2 98 %.  GEN: WDWN, Non-toxic, Atraumatic, normocephalic. A and O x 3. HEENT: Oropharynx clear without exudate, MMM, no significant LAD,  mild rhinnorhea Ears: TM clear, COL visualized with good landmarks CV: RRR, no m/g/r. Pulm: CTA B, no wheezes, rhonchi, or crackles, normal respiratory effort. EXT: no c/c/e Psych: well oriented, neither depressed nor anxious in appearance  Objective Data:  Assessment and Plan:   URI, acute  ETD (Eustachian tube dysfunction), bilateral  Supportive care reviewed with patient. See patient instruction section.  Patient Instructions  Eustachian Tube Dysfunction: There is a tube that connects between the sinuses and behind the ear called the "eustachian tube." Sometimes when you have allergies, a cold, or nasal congestion for any reason this tube can get blocked and pressure cannot equalize in your ears. (Like if you swim in deep water) This can also trap fluid behind the ear and give you a full, pressure-like sensation that is uncomfortable, but it is not an ear infection.  Recommendations: Afrin Nasal Spray: 2 sprays twice a day for a maximum of 3-4 days (longer than this and your nose gets addicted, and you have rebound swelling that makes it worse.) Sudafed: Either pseudoephedrine or phenylephrine. As directed on box. (Not is heart problems or high blood pressure) Anti-Histamine: Allegra, Zyrtec, or Claritin. All over the counter now and once a day.  Nasal steroid. Nasacort is over the counter now. About 10 prescription ones exist.  If you develop fever > 100.4, then things can change fluid behind the ear does increase your risk of developing an ear infection.     Follow-up: No Follow-up on file.  Meds ordered this encounter  Medications  . HYDROcodone-homatropine (HYCODAN) 5-1.5 MG/5ML syrup    Sig: 1 tsp po at night before bed prn cough    Dispense:  120 mL    Refill:  0    Signed,  Davis Vannatter T. Darren Nodal, MD     Medication List        Accurate as of 05/06/17 11:59 PM. Always use your most recent med list.          ALPRAZolam 0.25 MG tablet Commonly known as:   XANAX   budesonide 32 MCG/ACT nasal spray Commonly known as:  RHINOCORT AQUA Place 2 sprays daily into both nostrils.   HYDROcodone-homatropine 5-1.5 MG/5ML syrup Commonly known as:  HYCODAN 1 tsp po at night before bed prn cough   IBUPROFEN PO   ketoconazole 2 % shampoo Commonly known as:  NIZORAL       Where to Get Your Medications    You can get these medications from any pharmacy   Bring a paper prescription for each of these medications  HYDROcodone-homatropine 5-1.5 MG/5ML syrup

## 2017-05-06 NOTE — Patient Instructions (Signed)
Eustachian Tube Dysfunction: There is a tube that connects between the sinuses and behind the ear called the "eustachian tube." Sometimes when you have allergies, a cold, or nasal congestion for any reason this tube can get blocked and pressure cannot equalize in your ears. (Like if you swim in deep water) This can also trap fluid behind the ear and give you a full, pressure-like sensation that is uncomfortable, but it is not an ear infection.  Recommendations: Afrin Nasal Spray: 2 sprays twice a day for a maximum of 3-4 days (longer than this and your nose gets addicted, and you have rebound swelling that makes it worse.) Sudafed: Either pseudoephedrine or phenylephrine. As directed on box. (Not is heart problems or high blood pressure) Anti-Histamine: Allegra, Zyrtec, or Claritin. All over the counter now and once a day.  Nasal steroid. Nasacort is over the counter now. About 10 prescription ones exist.  If you develop fever > 100.4, then things can change fluid behind the ear does increase your risk of developing an ear infection.  

## 2017-05-15 ENCOUNTER — Telehealth: Payer: Self-pay | Admitting: Family Medicine

## 2017-05-15 ENCOUNTER — Telehealth: Payer: Self-pay

## 2017-05-15 ENCOUNTER — Ambulatory Visit: Payer: Self-pay | Admitting: *Deleted

## 2017-05-15 MED ORDER — AMOXICILLIN-POT CLAVULANATE 875-125 MG PO TABS: 1.0000 | ORAL_TABLET | Freq: Two times a day (BID) | ORAL | 0 refills | Status: DC

## 2017-05-15 NOTE — Telephone Encounter (Signed)
Pt was seen 05/06/17, cough is better but now a lot of sinus pressure on lt side of head;upper teeth hurting and when blows nose has yellow mucus. No fever. Prod cough with thick white phlegm. Request abx to [facility] [place].

## 2017-05-15 NOTE — Telephone Encounter (Signed)
[facility] order for Ms. [name].   Per pharmacy:   Patient has allergy to Ceftin, amoxicillin-clavulanate (AUGMENTIN) 875-125 MG tablet was called in today. In same drug category. Please advise

## 2017-05-15 NOTE — Telephone Encounter (Signed)
Make sure she can take pcn/amox  augmentin 875 mg, 1 po bid, #20

## 2017-05-15 NOTE — Telephone Encounter (Signed)
Per todays telephone note advised pt said she can take augmentin. Anna at [employer] voiced understanding.nothing further needed.

## 2017-05-15 NOTE — Telephone Encounter (Signed)
Spoke with [name].  She states she can take PCN/AMOX.  [facility] [place] as instructed by Dr. Lorelei Pont.

## 2017-08-16 IMAGING — DX DG FOOT COMPLETE 3+V*R*
3 series · 3 of 3 positions shown · non-contrast
Comparison: None.

CLINICAL DATA: Right foot pain.  Injured 1 week ago.

EXAM:
RIGHT FOOT COMPLETE - 3+ VIEW

[foot ap]
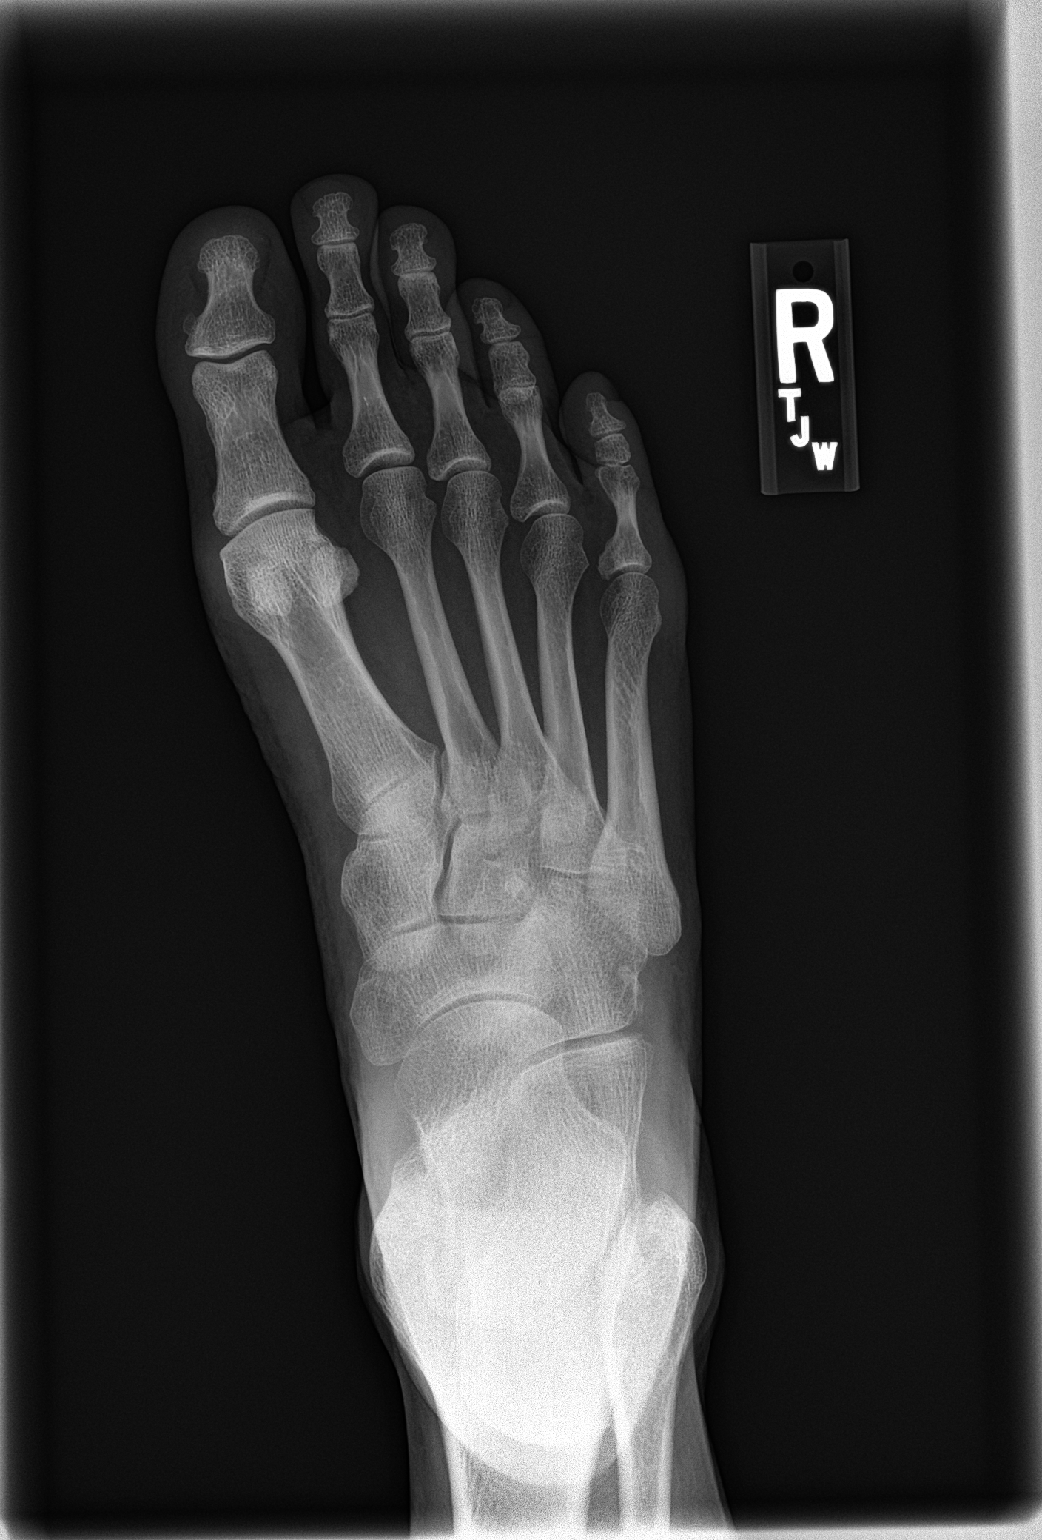

[foot obl]
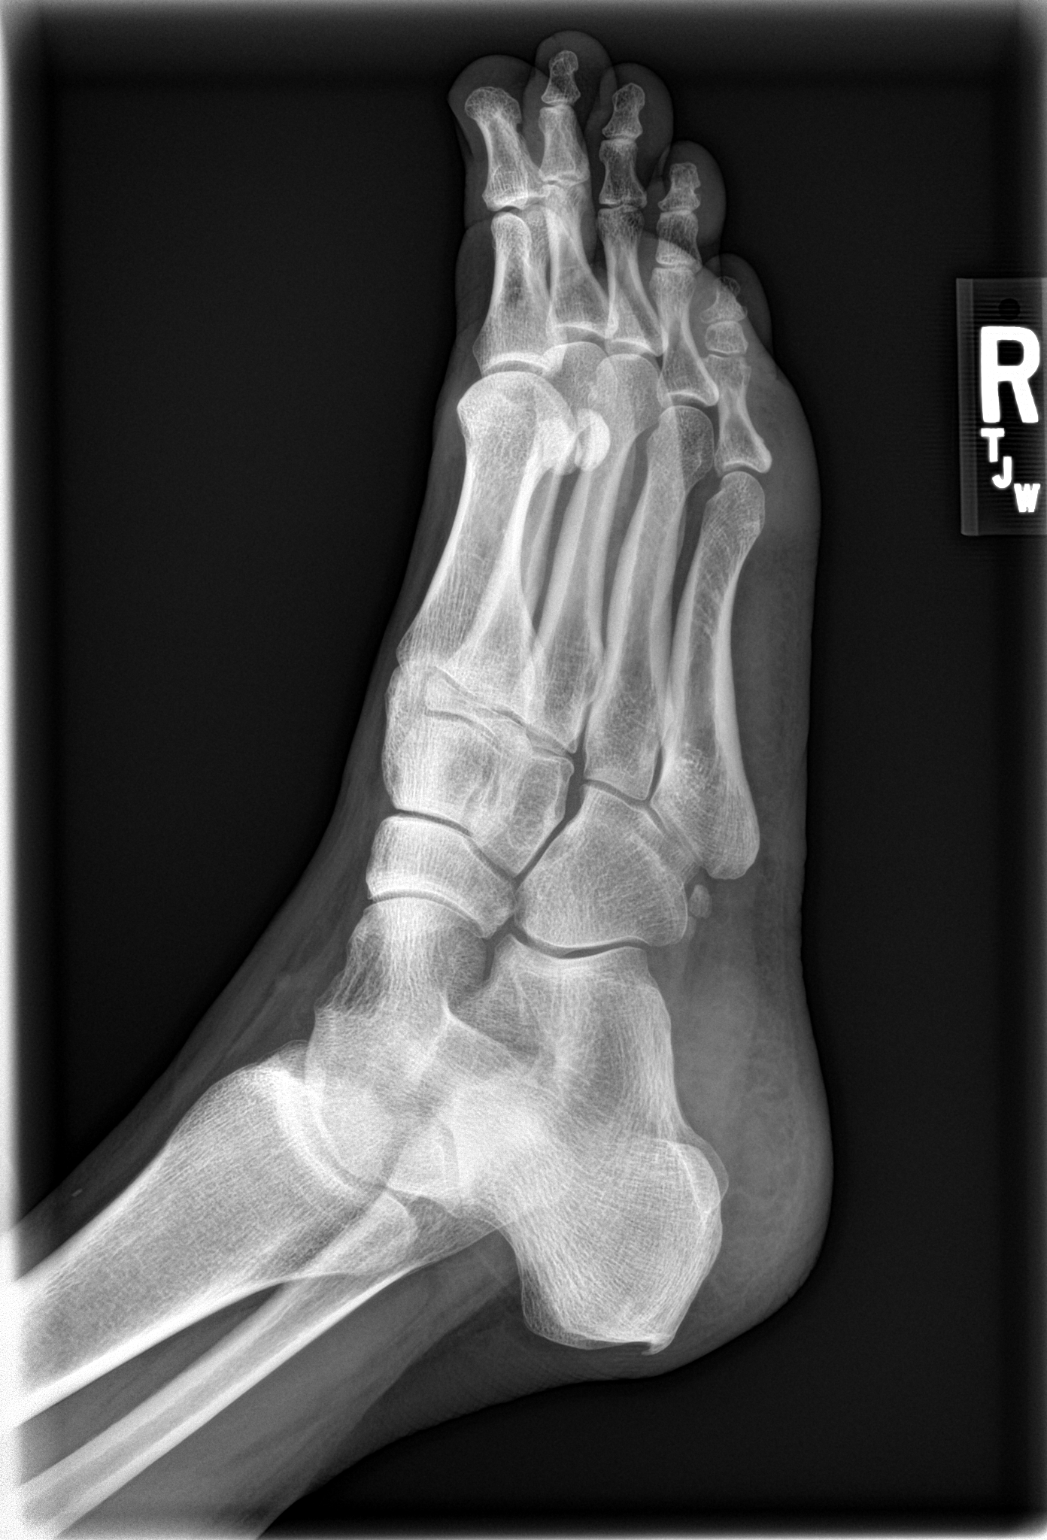

[foot lat]
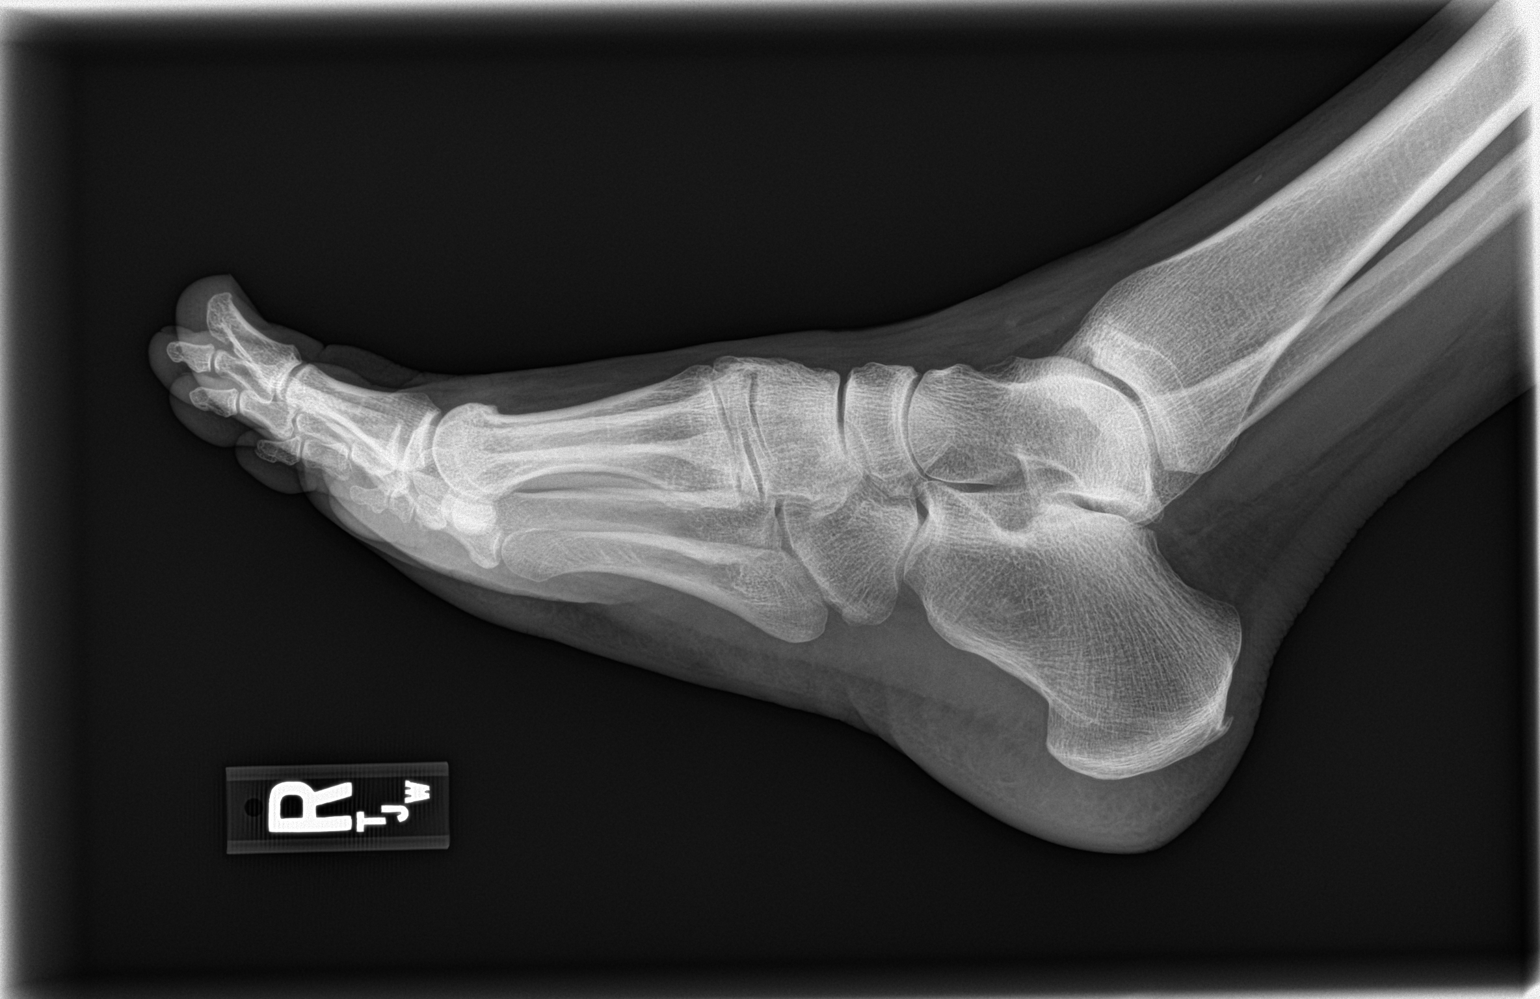

[3 of 3 positions shown; findings below may reference images not displayed]

FINDINGS: The right foot demonstrates no fracture or dislocation. There is no
soft tissue abnormality. There is no subcutaneous emphysema or
radiopaque foreign bodies.
IMPRESSION: No acute osseous injury of the right foot.

## 2017-12-20 ENCOUNTER — Telehealth: Payer: Self-pay | Admitting: *Deleted

## 2017-12-20 NOTE — Telephone Encounter (Signed)
Erroneous encounter

## 2018-02-17 ENCOUNTER — Ambulatory Visit: Payer: 59 | Admitting: Family Medicine

## 2018-02-17 ENCOUNTER — Encounter: Payer: Self-pay | Admitting: Family Medicine

## 2018-02-17 VITALS — BP 110/68 | HR 95 | Temp 98.2°F | Ht 66.75 in | Wt 168.0 lb

## 2018-02-17 DIAGNOSIS — B9789 Other viral agents as the cause of diseases classified elsewhere: Secondary | ICD-10-CM | POA: Diagnosis not present

## 2018-02-17 DIAGNOSIS — J069 Acute upper respiratory infection, unspecified: Secondary | ICD-10-CM

## 2018-02-17 NOTE — Patient Instructions (Signed)
To thin secretions take plain Mucinex  Continue cough syrup  For nasal congestion can use Afrin type nasal spray two sprays in each nostril twice a day for up to 3 days.   If not better in a couple of day, please let me know

## 2018-02-17 NOTE — Progress Notes (Signed)
Subjective:    Patient ID: [name], female    DOB: 01-May-1961, 57 y.o.   MRN: [id number]  HPI This is a 57 yo female who presents today with cough x 1 week. Cough is dry. Sore throat. Taking some old cough syrup with good relief, no other meds for symptoms. No fever, no wheeze, some SOB yesterday, nose stopped up, ear pressure. Some post nasal drainage. Takes zyrtec. Can't take decongestants.  Sick contacts- works in office  Past Medical History:  Diagnosis Date  . Abnormal glandular Papanicolaou smear of cervix   . Hemorrhage    gastrointestinal, associated with GI infection  . History of cervical biopsy 1986   hemorrhage, negative biopsy  . NSVD (normal spontaneous vaginal delivery)      X 3   Past Surgical History:  Procedure Laterality Date  . COLONOSCOPY    . TUBAL LIGATION    . WISDOM TOOTH EXTRACTION     Family History  Problem Relation Age of Onset  . Hyperlipidemia Father   . Diabetes Maternal Aunt   . Cancer Paternal Grandfather        lung  . Cancer Maternal Aunt        breast, grandmother's sister  . Cancer Maternal Aunt        colon  . Breast cancer Paternal Aunt   . Heart disease Neg Hx        no MI's less than 55   Social History   Tobacco Use  . Smoking status: Former Smoker    Packs/day: 0.25    Years: 5.00    Pack years: 1.25    Types: Cigarettes  . Smokeless tobacco: Never Used  Substance Use Topics  . Alcohol use: Yes    Alcohol/week: 0.0 standard drinks    Comment: socially  . Drug use: No      Review of Systems Per HPI    Objective:   Physical Exam  Constitutional: She is oriented to person, place, and time. She appears well-developed and well-nourished. No distress.  HENT:  Head: Normocephalic and atraumatic.  Right Ear: Tympanic membrane, external ear and ear canal normal.  Left Ear: Tympanic membrane, external ear and ear canal normal.  Nose: Mucosal edema present.  Mouth/Throat: Uvula is midline, oropharynx is clear  and moist and mucous membranes are normal.  Eyes: Conjunctivae are normal.  Neck: Normal range of motion. Neck supple.  Cardiovascular: Normal rate, regular rhythm and normal heart sounds.  Pulmonary/Chest: Effort normal and breath sounds normal.  Lymphadenopathy:    She has no cervical adenopathy.  Neurological: She is alert and oriented to person, place, and time.  Skin: Skin is warm and dry. She is not diaphoretic.  Psychiatric: She has a normal mood and affect. Her behavior is normal. Judgment and thought content normal.  Vitals reviewed.     BP 110/68 (BP Location: Right Arm, Patient Position: Sitting, Cuff Size: Normal)   Pulse 95   Temp 98.2 F (36.8 C) (Oral)   Ht 5' 6.75" (1.695 m)   Wt 168 lb (76.2 kg)   LMP 04/09/2012   SpO2 98%   BMI 26.51 kg/m  Wt Readings from Last 3 Encounters:  02/17/18 168 lb (76.2 kg)  05/06/17 167 lb (75.8 kg)  04/19/17 166 lb 12.8 oz (75.7 kg)       Assessment & Plan:  1. Viral URI with cough - Provided written and verbal information regarding diagnosis and treatment. -  Patient Instructions  To  thin secretions take plain Mucinex  Continue cough syrup  For nasal congestion can use Afrin type nasal spray two sprays in each nostril twice a day for up to 3 days.   If not better in a couple of day, please let me know       Clarene Reamer, FNP-BC  [facility], [place]  02/17/2018 12:22 PM

## 2018-03-24 ENCOUNTER — Ambulatory Visit (INDEPENDENT_AMBULATORY_CARE_PROVIDER_SITE_OTHER): Payer: 59 | Admitting: Family Medicine

## 2018-03-24 ENCOUNTER — Encounter: Payer: Self-pay | Admitting: Family Medicine

## 2018-03-24 VITALS — BP 112/95 | HR 80 | Temp 98.2°F | Ht 66.75 in | Wt 164.4 lb

## 2018-03-24 DIAGNOSIS — B9789 Other viral agents as the cause of diseases classified elsewhere: Secondary | ICD-10-CM

## 2018-03-24 DIAGNOSIS — J069 Acute upper respiratory infection, unspecified: Secondary | ICD-10-CM

## 2018-03-24 MED ORDER — HYDROCODONE-HOMATROPINE 5-1.5 MG/5ML PO SYRP: ORAL_SOLUTION | ORAL | 0 refills | Status: DC

## 2018-03-24 MED ORDER — HYDROCODONE-HOMATROPINE 5-1.5 MG/5ML PO SYRP: 5.0000 mL | ORAL_SOLUTION | Freq: Four times a day (QID) | ORAL | 0 refills | Status: DC | PRN

## 2018-03-24 NOTE — Progress Notes (Signed)
Subjective:    Patient ID: [name], female    DOB: October 07, 1960, 57 y.o.   MRN: [id number]  HPI This is a 57 yo female who presents today with 1 week of cough with intermittent yellow phlegm production. Had URI last month, completely resolved. Has been using cough syrup from last month. No fever. No ear pain, sore throat at beginning of illness, now resolved. Has tried mucinex, little relief. No SOB, no Wheeze. Some post nasal drainage and clear nasal drainage. No known sick contacts.   Past Medical History:  Diagnosis Date  . Abnormal glandular Papanicolaou smear of cervix   . Hemorrhage    gastrointestinal, associated with GI infection  . History of cervical biopsy 1986   hemorrhage, negative biopsy  . NSVD (normal spontaneous vaginal delivery)      X 3   Past Surgical History:  Procedure Laterality Date  . COLONOSCOPY    . TUBAL LIGATION    . WISDOM TOOTH EXTRACTION     Family History  Problem Relation Age of Onset  . Hyperlipidemia Father   . Diabetes Maternal Aunt   . Cancer Paternal Grandfather        lung  . Cancer Maternal Aunt        breast, grandmother's sister  . Cancer Maternal Aunt        colon  . Breast cancer Paternal Aunt   . Heart disease Neg Hx        no MI's less than 55   Social History   Tobacco Use  . Smoking status: Former Smoker    Packs/day: 0.25    Years: 5.00    Pack years: 1.25    Types: Cigarettes  . Smokeless tobacco: Never Used  Substance Use Topics  . Alcohol use: Yes    Alcohol/week: 0.0 standard drinks    Comment: socially  . Drug use: No      Review of Systems Per HPI    Objective:   Physical Exam  Constitutional: She is oriented to person, place, and time. She appears well-developed and well-nourished.  HENT:  Head: Normocephalic and atraumatic.  Right Ear: Tympanic membrane, external ear and ear canal normal.  Left Ear: Tympanic membrane, external ear and ear canal normal.  Nose: Mucosal edema and rhinorrhea  present.  Mouth/Throat: Uvula is midline and oropharynx is clear and moist.  Eyes: Conjunctivae are normal.  Neck: Normal range of motion. Neck supple.  Cardiovascular: Normal rate, regular rhythm and normal heart sounds.  Pulmonary/Chest: Effort normal and breath sounds normal.  Occasional dry cough while in exam room.   Neurological: She is alert and oriented to person, place, and time.  Skin: Skin is warm and dry.  Psychiatric: She has a normal mood and affect. Her behavior is normal. Judgment and thought content normal.  Vitals reviewed.     BP (!) 112/95   Pulse 80   Temp 98.2 F (36.8 C) (Oral)   Ht 5' 6.75" (1.695 m)   Wt 164 lb 6.4 oz (74.6 kg)   LMP 04/09/2012   SpO2 97%   BMI 25.94 kg/m  Wt Readings from Last 3 Encounters:  03/24/18 164 lb 6.4 oz (74.6 kg)  02/17/18 168 lb (76.2 kg)  05/06/17 167 lb (75.8 kg)       Assessment & Plan:  1. Viral URI with cough - Provided written and verbal information regarding diagnosis and treatment. - RTC instructions provided - increase fluids, rest, mucinex prn - HYDROcodone-homatropine (HYCODAN)  5-1.5 MG/5ML syrup; Take 5 mLs by mouth every 6 (six) hours as needed for cough.  Dispense: 120 mL; Refill: 0   Clarene Reamer, FNP-BC  [facility], [facility]  03/25/2018 6:58 AM

## 2018-03-25 ENCOUNTER — Encounter: Payer: Self-pay | Admitting: Family Medicine

## 2018-07-16 ENCOUNTER — Other Ambulatory Visit: Payer: 59

## 2018-07-22 ENCOUNTER — Ambulatory Visit: Payer: 59 | Admitting: Family Medicine

## 2018-08-28 ENCOUNTER — Other Ambulatory Visit: Payer: 59

## 2018-09-02 ENCOUNTER — Ambulatory Visit: Payer: 59 | Admitting: Family Medicine

## 2018-11-19 ENCOUNTER — Telehealth: Payer: Self-pay | Admitting: Family Medicine

## 2018-11-19 DIAGNOSIS — Z13220 Encounter for screening for lipoid disorders: Secondary | ICD-10-CM

## 2018-11-19 NOTE — Telephone Encounter (Signed)
-----   Message from Cloyd Stagers, RT sent at 11/12/2018  4:51 PM EDT ----- Regarding: Lab Orders for Thursday 7.16.2020 Please place lab orders for Thursday 7.16.2020, office visit for physical on Friday 7.17.2020 Thank you, Dyke Maes RT(R)

## 2018-11-20 ENCOUNTER — Other Ambulatory Visit (INDEPENDENT_AMBULATORY_CARE_PROVIDER_SITE_OTHER): Payer: 59

## 2018-11-20 ENCOUNTER — Other Ambulatory Visit: Payer: Self-pay

## 2018-11-20 DIAGNOSIS — Z13220 Encounter for screening for lipoid disorders: Secondary | ICD-10-CM | POA: Diagnosis not present

## 2018-11-20 LAB — COMPREHENSIVE METABOLIC PANEL
ALT: 28 U/L (ref 0–35)
AST: 18 U/L (ref 0–37)
Albumin: 4.2 g/dL (ref 3.5–5.2)
Alkaline Phosphatase: 75 U/L (ref 39–117)
BUN: 11 mg/dL (ref 6–23)
CO2: 30 mEq/L (ref 19–32)
Calcium: 8.7 mg/dL (ref 8.4–10.5)
Chloride: 104 mEq/L (ref 96–112)
Creatinine, Ser: 0.91 mg/dL (ref 0.40–1.20)
GFR: 63.92 mL/min (ref >60.00)
Glucose, Bld: 110 mg/dL — ABNORMAL HIGH (ref 70–99)
Potassium: 3.9 mEq/L (ref 3.5–5.1)
Sodium: 140 mEq/L (ref 135–145)
Total Bilirubin: 0.4 mg/dL (ref 0.2–1.2)
Total Protein: 6.5 g/dL (ref 6.0–8.3)

## 2018-11-20 LAB — LIPID PANEL
Cholesterol: 207 mg/dL — ABNORMAL HIGH (ref 0–200)
HDL: 48.8 mg/dL (ref >39.00)
LDL Cholesterol: 133 mg/dL — ABNORMAL HIGH (ref 0–99)
NonHDL: 158.14
Total CHOL/HDL Ratio: 4
Triglycerides: 127 mg/dL (ref 0.0–149.0)
VLDL: 25.4 mg/dL (ref 0.0–40.0)

## 2018-11-20 LAB — HEMOGLOBIN A1C: Hgb A1c MFr Bld: 5.9 % (ref 4.6–6.5)

## 2018-11-21 ENCOUNTER — Ambulatory Visit (INDEPENDENT_AMBULATORY_CARE_PROVIDER_SITE_OTHER): Payer: 59 | Admitting: Family Medicine

## 2018-11-21 ENCOUNTER — Encounter: Payer: Self-pay | Admitting: Family Medicine

## 2018-11-21 VITALS — BP 110/72 | HR 98 | Temp 97.9°F | Ht 66.0 in | Wt 165.1 lb

## 2018-11-21 DIAGNOSIS — F3340 Major depressive disorder, recurrent, in remission, unspecified: Secondary | ICD-10-CM | POA: Diagnosis not present

## 2018-11-21 DIAGNOSIS — Z0000 Encounter for general adult medical examination without abnormal findings: Secondary | ICD-10-CM

## 2018-11-21 DIAGNOSIS — F411 Generalized anxiety disorder: Secondary | ICD-10-CM

## 2018-11-21 MED ORDER — KETOCONAZOLE 2 % EX SHAM: MEDICATED_SHAMPOO | CUTANEOUS | 5 refills | Status: DC

## 2018-11-21 MED ORDER — ALPRAZOLAM 0.25 MG PO TABS: 0.2500 mg | ORAL_TABLET | ORAL | 0 refills | Status: DC | PRN

## 2018-11-21 NOTE — Progress Notes (Signed)
Chief Complaint  Patient presents with  . Annual Exam    Patient is here today for a CPE.  Fasting labs were completed on 7.16.20.  PAP is due in 2022.  Mammogram is due in August at [facility]. Colonoscopy and Immunizations are UTD.    History of Present Illness: HPI  The patient is here for annual wellness exam and preventative care.    MDD, moderate: No longer on any medication. Well controlled.   GAD: Using alprazolam as needed.. but rarely.  Only once since 09/2018. Increased stress.   Office Visit from 11/21/2018 in [place] at [facility] Total Score  0      Insomnia, chronic: no longer on any medication.   Allergic rhinitis: in past well treated with rhinocort.  SE to flonase in past.  Cholesterol:  10 year risk per AHA cal: 1.9% Lab Results  Component Value Date   CHOL 207 (H) 11/20/2018   HDL 48.80 11/20/2018   LDLCALC 133 (H) 11/20/2018   LDLDIRECT 125.7 07/08/2008   TRIG 127.0 11/20/2018   CHOLHDL 4 11/20/2018  Diet compliance: good, drinks pepsi Exercise:  minimal Other complaints:  Wt Readings from Last 3 Encounters:  11/21/18 165 lb 1.9 oz (74.9 kg)  03/24/18 164 lb 6.4 oz (74.6 kg)  02/17/18 168 lb (76.2 kg)    COVID 19 screen No recent travel or known exposure to [place] The patient denies respiratory symptoms of COVID 19 at this time.  The importance of social distancing was discussed today.   Review of Systems  Constitutional: Negative for chills and fever.  HENT: Negative for congestion and ear pain.   Eyes: Negative for pain and redness.  Respiratory: Negative for cough and shortness of breath.   Cardiovascular: Negative for chest pain, palpitations and leg swelling.  Gastrointestinal: Negative for abdominal pain, blood in stool, constipation, diarrhea, nausea and vomiting.  Genitourinary: Negative for dysuria.  Musculoskeletal: Negative for falls and myalgias.  Skin: Negative for rash.  Neurological: Negative for  dizziness.  Psychiatric/Behavioral: Negative for depression. The patient is not nervous/anxious.       Past Medical History:  Diagnosis Date  . Abnormal glandular Papanicolaou smear of cervix   . Hemorrhage    gastrointestinal, associated with GI infection  . History of cervical biopsy 1986   hemorrhage, negative biopsy  . NSVD (normal spontaneous vaginal delivery)      X 3    reports that she has quit smoking. Her smoking use included cigarettes. She has a 1.25 pack-year smoking history. She has never used smokeless tobacco. She reports current alcohol use. She reports that she does not use drugs.   Current Outpatient Medications:  .  ALPRAZolam (XANAX) 0.25 MG tablet, Take 0.25 mg by mouth as needed for anxiety., Disp: , Rfl:  .  IBUPROFEN PO, Take by mouth as needed., Disp: , Rfl:  .  ketoconazole (NIZORAL) 2 % shampoo, USE 3 TIMES WEEKLY, LATHER ON SCALP, LEAVE ON 8-10 MINS, RINSE OUT, Disp: , Rfl: 0   Observations/Objective: Blood pressure 110/72, pulse 98, temperature 97.9 F (36.6 C), temperature source Oral, height 5\' 6"  (1.676 m), weight 165 lb 1.9 oz (74.9 kg), last menstrual period 04/09/2012, SpO2 97 %.  Physical Exam Constitutional:      General: She is not in acute distress.    Appearance: Normal appearance. She is well-developed. She is not ill-appearing or toxic-appearing.  HENT:     Head: Normocephalic.     Right Ear:  Hearing, tympanic membrane, ear canal and external ear normal.     Left Ear: Hearing, tympanic membrane, ear canal and external ear normal.     Nose: Nose normal.  Eyes:     General: Lids are normal. Lids are everted, no foreign bodies appreciated.     Conjunctiva/sclera: Conjunctivae normal.     Pupils: Pupils are equal, round, and reactive to light.  Neck:     Musculoskeletal: Normal range of motion and neck supple.     Thyroid: No thyroid mass or thyromegaly.     Vascular: No carotid bruit.     Trachea: Trachea normal.  Cardiovascular:      Rate and Rhythm: Normal rate and regular rhythm.     Heart sounds: Normal heart sounds, S1 normal and S2 normal. No murmur. No gallop.   Pulmonary:     Effort: Pulmonary effort is normal. No respiratory distress.     Breath sounds: Normal breath sounds. No wheezing, rhonchi or rales.  Abdominal:     General: Bowel sounds are normal. There is no distension or abdominal bruit.     Palpations: Abdomen is soft. There is no fluid wave or mass.     Tenderness: There is no abdominal tenderness. There is no guarding or rebound.     Hernia: No hernia is present.  Lymphadenopathy:     Cervical: No cervical adenopathy.  Skin:    General: Skin is warm and dry.     Findings: No rash.  Neurological:     Mental Status: She is alert.     Cranial Nerves: No cranial nerve deficit.     Sensory: No sensory deficit.  Psychiatric:        Mood and Affect: Mood is not anxious or depressed.        Speech: Speech normal.        Behavior: Behavior normal. Behavior is cooperative.        Judgment: Judgment normal.      Assessment and Plan The patient's preventative maintenance and recommended screening tests for an annual wellness exam were reviewed in full today. Brought up to date unless services declined.  Counselled on the importance of diet, exercise, and its role in overall health and mortality. The patient's FH and SH was reviewed, including their home life, tobacco status, and drug and alcohol status.   Vaccines: Tdap  Given. PAP/DVE: 02/2016 nml and no HPV MAMMO: 12/2016 low risk Colon:04/2010 [place], repeat in 10 years Hep C neg. Nonsmoker STD screen: refused      Eliezer Lofts, MD

## 2018-11-21 NOTE — Patient Instructions (Signed)
Call to set up mammogram.

## 2018-11-21 NOTE — Assessment & Plan Note (Signed)
Doing well overall 

## 2018-11-21 NOTE — Assessment & Plan Note (Signed)
Increase in stress but only using alprazolam once monthly or so. Not interested in counseling or SSRI to treat given occ symptoms,

## 2018-11-28 ENCOUNTER — Other Ambulatory Visit: Payer: Self-pay

## 2018-11-28 ENCOUNTER — Ambulatory Visit: Payer: 59 | Admitting: Internal Medicine

## 2018-11-28 ENCOUNTER — Encounter: Payer: Self-pay | Admitting: Internal Medicine

## 2018-11-28 DIAGNOSIS — H6982 Other specified disorders of Eustachian tube, left ear: Secondary | ICD-10-CM

## 2018-11-28 NOTE — Progress Notes (Signed)
Subjective:    Patient ID: [name], female    DOB: [birth year], 58 y.o.   MRN: [id number]  HPI  Patient presents to the clinic today for complaints of left ear pain.  This started 3 days ago. She describes the pain as fullness without loss of hearing. She denies runny nose, nasal congestion, sore throat, cough. She denies fever, chills or body aches.  She has tried Rhinocort with minimal relief.     Review of Systems      Past Medical History:  Diagnosis Date  . Abnormal glandular Papanicolaou smear of cervix   . Hemorrhage    gastrointestinal, associated with GI infection  . History of cervical biopsy 1986   hemorrhage, negative biopsy  . NSVD (normal spontaneous vaginal delivery)      X 3    Current Outpatient Medications  Medication Sig Dispense Refill  . ALPRAZolam (XANAX) 0.25 MG tablet Take 1 tablet (0.25 mg total) by mouth as needed for anxiety. 30 tablet 0  . IBUPROFEN PO Take by mouth as needed.    Marland Kitchen ketoconazole (NIZORAL) 2 % shampoo USE 3 TIMES WEEKLY, LATHER , LEAVE ON 8-10 MINS, RINSE OFF 120 mL 5   No current facility-administered medications for this visit.     Allergies  Allergen Reactions  . Cefuroxime Axetil Hives and Itching  . Ciprofloxacin Hcl Swelling    Swelling of lips    Family History  Problem Relation Age of Onset  . Hyperlipidemia Father   . Diabetes Maternal Aunt   . Cancer Paternal Grandfather        lung  . Cancer Maternal Aunt        breast, grandmother's sister  . Cancer Maternal Aunt        colon  . Breast cancer Paternal Aunt   . Heart disease Neg Hx        no MI's less than 55    Social History   Socioeconomic History  . Marital status: Married    Spouse name: Not on file  . Number of children: 3  . Years of education: Not on file  . Highest education level: Not on file  Occupational History  . Occupation: medical receptionist  Social Needs  . Financial resource strain: Not on file  . Food insecurity   Worry: Not on file    Inability: Not on file  . Transportation needs    Medical: Not on file    Non-medical: Not on file  Tobacco Use  . Smoking status: Former Smoker    Packs/day: 0.25    Years: 5.00    Pack years: 1.25    Types: Cigarettes  . Smokeless tobacco: Never Used  Substance and Sexual Activity  . Alcohol use: Yes    Alcohol/week: 0.0 standard drinks    Comment: socially  . Drug use: No  . Sexual activity: Not on file  Lifestyle  . Physical activity    Days per week: Not on file    Minutes per session: Not on file  . Stress: Not on file  Relationships  . Social Herbalist on phone: Not on file    Gets together: Not on file    Attends religious service: Not on file    Active member of club or organization: Not on file    Attends meetings of clubs or organizations: Not on file    Relationship status: Not on file  . Intimate partner violence  Fear of current or ex partner: Not on file    Emotionally abused: Not on file    Physically abused: Not on file    Forced sexual activity: Not on file  Other Topics Concern  . Not on file  Social History Narrative   Regular exercise at [employer], 3-5 days a week.    Diet: fruits and veggies, grilled. Good water intake, only one pepsi a day, minimal calcium.     Constitutional: Denies fever, malaise, fatigue, headache or abrupt weight changes.  HEENT: Pt reports left ear pain. Denies eye pain, eye redness,  ringing in the ears, wax buildup, runny nose, nasal congestion, bloody nose, or sore throat. Respiratory: Denies difficulty breathing, shortness of breath, cough or sputum production.   Cardiovascular: Denies chest pain, chest tightness, palpitations or swelling in the hands or feet.  Gastrointestinal: Denies abdominal pain, bloating, constipation, diarrhea or blood in the stool.   No other specific complaints in a complete review of systems (except as listed in HPI above).  Objective:   Physical Exam    Wt Readings from Last 3 Encounters:  11/21/18 165 lb 1.9 oz (74.9 kg)  03/24/18 164 lb 6.4 oz (74.6 kg)  02/17/18 168 lb (76.2 kg)    General: Appears her stated age, well developed, well nourished in NAD. HEENT: Head: normal shape and size; Eyes: sclera white, no icterus, conjunctiva pink, PERRLA and EOMs intact; Ears: Tm's gray and intact, normal light reflex, + serous effusion on the left;  Neck:   No adenopathy noted.  Cardiovascular: Normal rate and rhythm.  Pulmonary/Chest: Normal effort and positive vesicular breath sounds. No respiratory distress. No wheezes, rales or ronchi noted.  Neurological: Alert and oriented.    BMET    Component Value Date/Time   NA 140 11/20/2018 0727   K 3.9 11/20/2018 0727   CL 104 11/20/2018 0727   CO2 30 11/20/2018 0727   GLUCOSE 110 (H) 11/20/2018 0727   GLUCOSE 90 05/17/2006 1128   BUN 11 11/20/2018 0727   CREATININE 0.91 11/20/2018 0727   CALCIUM 8.7 11/20/2018 0727   GFRNONAA >90 04/23/2012 1538   GFRAA >90 04/23/2012 1538    Lipid Panel     Component Value Date/Time   CHOL 207 (H) 11/20/2018 0727   TRIG 127.0 11/20/2018 0727   TRIG 48 05/17/2006 1128   HDL 48.80 11/20/2018 0727   CHOLHDL 4 11/20/2018 0727   VLDL 25.4 11/20/2018 0727   LDLCALC 133 (H) 11/20/2018 0727    CBC    Component Value Date/Time   WBC 3.7 (L) 08/28/2016 0859   RBC 4.70 08/28/2016 0859   HGB 14.1 08/28/2016 0859   HCT 42.4 08/28/2016 0859   PLT 232.0 08/28/2016 0859   MCV 90.2 08/28/2016 0859   MCH 30.6 04/23/2012 1538   MCHC 33.3 08/28/2016 0859   RDW 13.0 08/28/2016 0859   LYMPHSABS 1.5 08/28/2016 0859   MONOABS 0.2 08/28/2016 0859   EOSABS 0.2 08/28/2016 0859   BASOSABS 0.1 08/28/2016 0859    Hgb A1C Lab Results  Component Value Date   HGBA1C 5.9 11/20/2018           Assessment & Plan:   ETD, Left:  Increase Rhinocort to 1 spray BID Discussed oral steroids but she wants to hold off at this time  Return precautions  discussed Webb Silversmith, NP

## 2018-11-28 NOTE — Patient Instructions (Signed)
Ear Barotrauma, Adult Ear barotrauma is irritation and swelling (inflammation) around the eardrum (middle ear). This happens when a change in air pressure causes a blockage in a tube in the ear (eustachian tube). A pressure change may happen when:  Flying in an airplane.  Coming to the surface too quickly when scuba diving.  Going to a high place (high altitude) quickly.  Being too close to an explosion or blast. Follow these instructions at home: General instructions  Take over-the-counter and prescription medicines only as told by your doctor.  "Pop" your ears (equalize pressure) as told by your doctor. Ways to pop your ears include: ? Yawning. ? Chewing gum. ? Swallowing. ? Holding your nose closed and gently blowing.  Do not put anything into your ears to clean or unplug them. Ear drops will not help.  Do not do the following until your doctor says it is okay: ? Travel to places that are high above sea level, such as mountains. ? Fly. ? Scuba dive.  Keep all follow-up visits as told by your doctor. This is important. How is this prevented?  Avoid activities that may bring pressure changes when you have symptoms of a cold or stuffiness (congestion).  If you have stuffiness and will be flying, take medicine to relieve stuffiness (decongestant) 30-60 minutes before flying.  When flying, during takeoff and landing: ? Chew gum. ? Swallow hard and often.  Hold your nose closed and gently blow to pop your ears.  Yawn during air pressure changes.  If scuba diving, dive feet first and pop your ears often as you go deeper. Contact a doctor if:  Your symptoms get worse.  Your symptoms do not get better.  You feel dizzy (vertigo).  You have hearing loss.  You have a fever. Get help right away if:  You have very bad ear pain.  You have a very bad headache.  You have very bad dizziness.  You have blood or pus coming from your ear.  You have balance problems.   You cannot move or feel part of your face. Summary  Ear barotrauma is irritation and swelling (inflammation) around the eardrum (middle ear).  This condition may be treated with medicines or by "popping" your ears (equalizing pressure).  You can take steps to help prevent ear barotrauma. This information is not intended to replace advice given to you by your health care provider. Make sure you discuss any questions you have with your health care provider. Document Released: 10/11/2009 Document Revised: 04/05/2017 Document Reviewed: 08/03/2016 Elsevier Patient Education  2020 [employer].

## 2019-03-16 ENCOUNTER — Other Ambulatory Visit: Payer: Self-pay

## 2019-08-06 ENCOUNTER — Other Ambulatory Visit: Payer: Self-pay

## 2019-08-06 ENCOUNTER — Encounter: Payer: Self-pay | Admitting: Family Medicine

## 2019-08-06 ENCOUNTER — Ambulatory Visit: Payer: 59 | Admitting: Family Medicine

## 2019-08-06 VITALS — BP 110/62 | HR 82 | Temp 97.9°F | Ht 66.0 in | Wt 160.2 lb

## 2019-08-06 DIAGNOSIS — F4321 Adjustment disorder with depressed mood: Secondary | ICD-10-CM

## 2019-08-06 DIAGNOSIS — R1031 Right lower quadrant pain: Secondary | ICD-10-CM | POA: Diagnosis not present

## 2019-08-06 DIAGNOSIS — R110 Nausea: Secondary | ICD-10-CM | POA: Diagnosis not present

## 2019-08-06 DIAGNOSIS — F3340 Major depressive disorder, recurrent, in remission, unspecified: Secondary | ICD-10-CM

## 2019-08-06 DIAGNOSIS — M549 Dorsalgia, unspecified: Secondary | ICD-10-CM | POA: Diagnosis not present

## 2019-08-06 DIAGNOSIS — Y999 Unspecified external cause status: Secondary | ICD-10-CM | POA: Diagnosis not present

## 2019-08-06 DIAGNOSIS — Y92410 Unspecified street and highway as the place of occurrence of the external cause: Secondary | ICD-10-CM | POA: Diagnosis not present

## 2019-08-06 DIAGNOSIS — R109 Unspecified abdominal pain: Secondary | ICD-10-CM | POA: Diagnosis not present

## 2019-08-06 DIAGNOSIS — R52 Pain, unspecified: Secondary | ICD-10-CM | POA: Diagnosis not present

## 2019-08-06 DIAGNOSIS — M545 Low back pain: Secondary | ICD-10-CM | POA: Diagnosis not present

## 2019-08-06 DIAGNOSIS — I10 Essential (primary) hypertension: Secondary | ICD-10-CM | POA: Diagnosis not present

## 2019-08-06 DIAGNOSIS — M546 Pain in thoracic spine: Secondary | ICD-10-CM | POA: Diagnosis not present

## 2019-08-06 DIAGNOSIS — S060X9A Concussion with loss of consciousness of unspecified duration, initial encounter: Secondary | ICD-10-CM

## 2019-08-06 DIAGNOSIS — S060XAA Concussion with loss of consciousness status unknown, initial encounter: Secondary | ICD-10-CM

## 2019-08-06 HISTORY — DX: Concussion with loss of consciousness of unspecified duration, initial encounter: S06.0X9A

## 2019-08-06 HISTORY — DX: Concussion with loss of consciousness status unknown, initial encounter: S06.0XAA

## 2019-08-06 MED ORDER — LORAZEPAM 0.5 MG PO TABS: 0.5000 mg | ORAL_TABLET | Freq: Every day | ORAL | 0 refills | Status: AC | PRN

## 2019-08-06 NOTE — Assessment & Plan Note (Signed)
We will try a low dose trial of ativan prn.  If symptoms worsening she will follow up for consideration of daily med or counseling.  PDMP reviewed.. no red flags.

## 2019-08-06 NOTE — Progress Notes (Signed)
Chief Complaint  Patient presents with  . Medication Management    History of Present Illness: HPI   59 year old female presents with continued anxiety, slightly worse lately given the diagnosis of her father with lymphoma. He lives 6 hours a way. When she thinks and talks about this she feels anxious and teary.   Xanax makes her sleepy.  She has used  17 in the last 9 months.  She is able to function at home and at work , but she needs something to use when she is overwhelmed that she can take and still function. No panic attacks. No issues sleeping.   Not happening all the time.  She does not want to take an every day medication.   PHQ9 0  She tried her friend's ativan and it worked better for her without making her sleepy.   This visit occurred during the SARS-CoV-2 public health emergency.  Safety protocols were in place, including screening questions prior to the visit, additional usage of staff PPE, and extensive cleaning of exam room while observing appropriate contact time as indicated for disinfecting solutions.   COVID 19 screen:  No recent travel or known exposure to COVID19 The patient denies respiratory symptoms of COVID 19 at this time. The importance of social distancing was discussed today.     Review of Systems  Constitutional: Negative for chills and fever.  HENT: Negative for congestion and ear pain.   Eyes: Negative for pain and redness.  Respiratory: Negative for cough and shortness of breath.   Cardiovascular: Negative for chest pain, palpitations and leg swelling.  Gastrointestinal: Negative for abdominal pain, blood in stool, constipation, diarrhea, nausea and vomiting.  Genitourinary: Negative for dysuria.  Musculoskeletal: Negative for falls and myalgias.  Skin: Negative for rash.  Neurological: Negative for dizziness.  Psychiatric/Behavioral: Negative for depression. The patient is not nervous/anxious.       Past Medical History:   Diagnosis Date  . Abnormal glandular Papanicolaou smear of cervix   . Hemorrhage    gastrointestinal, associated with GI infection  . History of cervical biopsy 1986   hemorrhage, negative biopsy  . NSVD (normal spontaneous vaginal delivery)      X 3    reports that she has quit smoking. Her smoking use included cigarettes. She has a 1.25 pack-year smoking history. She has never used smokeless tobacco. She reports current alcohol use. She reports that she does not use drugs.   Current Outpatient Medications:  .  ALPRAZolam (XANAX) 0.25 MG tablet, Take 1 tablet (0.25 mg total) by mouth as needed for anxiety., Disp: 30 tablet, Rfl: 0 .  IBUPROFEN PO, Take by mouth as needed., Disp: , Rfl:  .  ketoconazole (NIZORAL) 2 % shampoo, USE 3 TIMES WEEKLY, LATHER , LEAVE ON 8-10 MINS, RINSE OFF, Disp: 120 mL, Rfl: 5   Observations/Objective: Blood pressure 110/62, pulse 82, temperature 97.9 F (36.6 C), temperature source Temporal, height 5\' 6"  (1.676 m), weight 160 lb 4 oz (72.7 kg), last menstrual period 04/09/2012, SpO2 98 %.  Physical Exam Constitutional:      General: She is not in acute distress.    Appearance: Normal appearance. She is well-developed. She is not ill-appearing or toxic-appearing.  HENT:     Head: Normocephalic.     Right Ear: Hearing, tympanic membrane, ear canal and external ear normal. Tympanic membrane is not erythematous, retracted or bulging.     Left Ear: Hearing, tympanic membrane, ear canal and external ear normal. Tympanic  membrane is not erythematous, retracted or bulging.     Nose: No mucosal edema or rhinorrhea.     Right Sinus: No maxillary sinus tenderness or frontal sinus tenderness.     Left Sinus: No maxillary sinus tenderness or frontal sinus tenderness.     Mouth/Throat:     Pharynx: Uvula midline.  Eyes:     General: Lids are normal. Lids are everted, no foreign bodies appreciated.     Conjunctiva/sclera: Conjunctivae normal.     Pupils: Pupils  are equal, round, and reactive to light.  Neck:     Thyroid: No thyroid mass or thyromegaly.     Vascular: No carotid bruit.     Trachea: Trachea normal.  Cardiovascular:     Rate and Rhythm: Normal rate and regular rhythm.     Pulses: Normal pulses.     Heart sounds: Normal heart sounds, S1 normal and S2 normal. No murmur. No friction rub. No gallop.   Pulmonary:     Effort: Pulmonary effort is normal. No tachypnea or respiratory distress.     Breath sounds: Normal breath sounds. No decreased breath sounds, wheezing, rhonchi or rales.  Abdominal:     General: Bowel sounds are normal.     Palpations: Abdomen is soft.     Tenderness: There is no abdominal tenderness.  Musculoskeletal:     Cervical back: Normal range of motion and neck supple.  Skin:    General: Skin is warm and dry.     Findings: No rash.  Neurological:     Mental Status: She is alert.  Psychiatric:        Mood and Affect: Mood is not anxious or depressed. Affect is tearful.        Speech: Speech normal.        Behavior: Behavior normal. Behavior is cooperative.        Thought Content: Thought content normal.        Judgment: Judgment normal.     Comments: tearful when taking about father's dianosis      Assessment and Plan   Situational depression  We will try a low dose trial of ativan prn.  If symptoms worsening she will follow up for consideration of daily med or counseling.  PDMP reviewed.. no red flags.     Eliezer Lofts, MD

## 2019-08-11 ENCOUNTER — Other Ambulatory Visit: Payer: Self-pay

## 2019-08-11 ENCOUNTER — Encounter: Payer: Self-pay | Admitting: Family Medicine

## 2019-08-11 ENCOUNTER — Ambulatory Visit (INDEPENDENT_AMBULATORY_CARE_PROVIDER_SITE_OTHER): Payer: 59 | Admitting: Family Medicine

## 2019-08-11 VITALS — BP 110/60 | HR 96 | Temp 98.2°F | Ht 66.0 in | Wt 160.5 lb

## 2019-08-11 DIAGNOSIS — S060X0A Concussion without loss of consciousness, initial encounter: Secondary | ICD-10-CM | POA: Insufficient documentation

## 2019-08-11 DIAGNOSIS — M25552 Pain in left hip: Secondary | ICD-10-CM | POA: Diagnosis not present

## 2019-08-11 DIAGNOSIS — R0789 Other chest pain: Secondary | ICD-10-CM

## 2019-08-11 DIAGNOSIS — M25551 Pain in right hip: Secondary | ICD-10-CM | POA: Insufficient documentation
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
## 2019-08-11 NOTE — Patient Instructions (Addendum)
Complete brain rest as discussed.  Gentle stretching of neck, heat and massage.  Gentle stretching of chest wall and hips.  Can use ibuprofen 400-600 mg every 6 hours for pain.   Concussion, Adult  A concussion is a brain injury from a hard, direct hit (trauma) to the head or body. This direct hit causes the brain to shake quickly back and forth inside the skull. This can damage brain cells and cause chemical changes in the brain. A concussion may also be known as a mild traumatic brain injury (TBI). Concussions are usually not life-threatening, but the effects of a concussion can be serious. If you have a concussion, you should be very careful to avoid having a second concussion. What are the causes? This condition is caused by:  A direct hit to your head, such as: ? Running into another player during a game. ? Being hit in a fight. ? Hitting your head on a hard surface.  Sudden movement of your body that causes your brain to move back and forth inside the skull, such as in a car crash. What are the signs or symptoms? The signs of a concussion can be hard to notice. Early on, they may be missed by you, family members, and health care providers. You may look fine on the outside but may act or feel differently. Symptoms are usually temporary and most often improve in 7-10 days. Some symptoms appear right away, but other symptoms may not show up for hours or days. If your symptoms last longer than normal, you may have post-concussion syndrome. Every head injury is different. Physical symptoms  Headaches. This can include a feeling of pressure in the head or migraine-like symptoms.  Tiredness (fatigue).  Dizziness.  Problems with coordination or balance.  Vision or hearing problems.  Sensitivity to light or noise.  Nausea or vomiting.  Changes in eating or sleeping patterns.  Numbness or tingling.  Seizure. Mental and emotional symptoms  Memory problems.  Trouble  concentrating, organizing, or making decisions.  Slowness in thinking, acting or reacting, speaking, or reading.  Irritability or mood changes.  Anxiety or depression. How is this diagnosed? This condition is diagnosed based on:  Your symptoms.  A description of your injury. You may also have tests, including:  Imaging tests, such as a CT scan or MRI.  Neuropsychological tests. These measure your thinking, understanding, learning, and remembering abilities. How is this treated? Treatment for this condition includes:  Stopping sports or activity if you are injured. If you hit your head or show signs of concussion: ? Do not return to sports or activities the same day. ? Get checked by a health care provider before you return to your activities.  Physical and mental rest and careful observation, usually at home. Gradually return to your normal activities.  Medicines to help with symptoms such as headaches, nausea, or difficulty sleeping. ? Avoid taking opioid pain medicine while recovering from a concussion.  Avoiding alcohol and drugs. These may slow your recovery and can put you at risk of further injury.  Referral to a concussion clinic or rehabilitation center. Recovery from a concussion can take time. How fast you recover depends on many factors. Return to activities only when:  Your symptoms are completely gone.  Your health care provider says that it is safe. Follow these instructions at home: Activity  Limit activities that require a lot of thought or concentration, such as: ? Doing homework or job-related work. ? Watching TV. ? Working  on the computer or phone. ? Playing memory games and puzzles.  Rest. Rest helps your brain heal. Make sure you: ? Get plenty of sleep. Most adults should get 7-9 hours of sleep each night. ? Rest during the day. Take naps or rest breaks when you feel tired.  Avoid physical activity like exercise until your health care provider  says it is safe. Stop any activity that worsens symptoms.  Do not do high-risk activities that could cause a second concussion, such as riding a bike or playing sports.  Ask your health care provider when you can return to your normal activities, such as school, work, athletics, and driving. Your ability to react may be slower after a brain injury. Never do these activities if you are dizzy. Your health care provider will likely give you a plan for gradually returning to activities. General instructions   Take over-the-counter and prescription medicines only as told by your health care provider. Some medicines, such as blood thinners (anticoagulants) and aspirin, may increase the risk for complications, such as bleeding.  Do not drink alcohol until your health care provider says you can.  Watch your symptoms and tell others around you to do the same. Complications sometimes occur after a concussion. Older adults with a brain injury may have a higher risk of serious complications.  Tell your work Freight forwarder, teachers, Government social research officer, school counselor, coach, or Product/process development scientist about your injury, symptoms, and restrictions.  Keep all follow-up visits as told by your health care provider. This is important. How is this prevented? Avoiding another brain injury is very important. In rare cases, another injury can lead to permanent brain damage, brain swelling, or death. The risk of this is greatest during the first 7-10 days after a head injury. Avoid injuries by:  Stopping activities that could lead to a second concussion, such as contact or recreational sports, until your health care provider says it is okay.  Taking these actions once you have returned to sports or activities: ? Avoiding plays or moves that can cause you to crash into another person. This is how most concussions occur. ? Following the rules and being respectful of other players. Do not engage in violent or illegal plays.  Getting  regular exercise that includes strength and balance training.  Wearing a properly fitting helmet during sports, biking, or other activities. Helmets can help protect you from serious skull and brain injuries, but they do not protect you from a concussion. Even when wearing a helmet, you should avoid being hit in the head. Contact a health care provider if:  Your symptoms get worse or they do not improve.  You have new symptoms.  You have another injury. Get help right away if:  You have severe or worsening headaches.  You have weakness or numbness in any part of your body.  You are confused.  Your coordination gets worse.  You vomit repeatedly.  You are sleepier than normal.  Your speech is slurred.  You cannot recognize people or places.  You have a seizure.  It is difficult to wake you up.  You have unusual behavior changes.  You have changes in your vision.  You lose consciousness. Summary  A concussion is a brain injury that results from a hard, direct hit (trauma) to your head or body.  You may have imaging tests and neuropsychological tests to diagnose a concussion.  Treatment for this condition includes physical and mental rest and careful observation.  Ask your health  care provider when you can return to your normal activities, such as school, work, athletics, and driving.  Get help right away if you have a severe headache, weakness on one side of the body, seizures, behavior changes, changes in vision, or if you are confused or sleepier than normal. This information is not intended to replace advice given to you by your health care provider. Make sure you discuss any questions you have with your health care provider. Document Revised: 12/12/2017 Document Reviewed: 12/12/2017 Elsevier Patient Education  [employer].

## 2019-08-11 NOTE — Assessment & Plan Note (Signed)
Likely MSK strain from tensing legs.. no ttp of pelvis. No clear indication for X-ray. Rec: NSAIDs, stretching and heat.

## 2019-08-11 NOTE — Assessment & Plan Note (Signed)
Improving symptoms. Out of work for complete brain rest until 08/17/2019. Reassessment for return to work by Dr. Lorelei Pont on 08/14/2019. Info provided.
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
## 2019-08-12 ENCOUNTER — Ambulatory Visit: Payer: 59 | Admitting: Family Medicine

## 2019-08-14 ENCOUNTER — Other Ambulatory Visit: Payer: Self-pay

## 2019-08-14 ENCOUNTER — Ambulatory Visit (INDEPENDENT_AMBULATORY_CARE_PROVIDER_SITE_OTHER): Payer: 59 | Admitting: Family Medicine

## 2019-08-14 ENCOUNTER — Encounter: Payer: Self-pay | Admitting: Family Medicine

## 2019-08-14 VITALS — BP 100/60 | HR 100 | Temp 98.0°F | Ht 66.0 in | Wt 160.0 lb

## 2019-08-14 DIAGNOSIS — S060X0A Concussion without loss of consciousness, initial encounter: Secondary | ICD-10-CM | POA: Diagnosis not present

## 2019-08-14 DIAGNOSIS — R0789 Other chest pain: Secondary | ICD-10-CM | POA: Diagnosis not present

## 2019-08-14 NOTE — Patient Instructions (Signed)
Mild Traumatic Brain Injury (CONCUSSSION):  Owens Loffler, MD, [facility] Adapted from [place], [employer], and [employer]  Think of the human brain almost as an egg yolk and your skull as an egg shell. When your head or body takes a hit, it can cause your brain to shake around inside your skull, injuring the brain. A concussion is not only caused by a hit to the head, but can also be caused by an impact to the body that ends up shaking your brain in your skull, such as whiplash.  A common misconception about concussion is that one loses consciousness. However, loss of consciousness occurs in only less than 10% of concussion cases.  Concussive symptoms typically resolve in 7 to 10 days (sports-related concussions) or within 3 months (non-athletes).  Approximately 20% of people have symptoms after 6 weeks.  With concussions, we have learned that it generally takes youth longer to recover from concussions. Another thing that we now know about concussions is that it usually takes female athletes longer to recover than female athletes.  No two concussions are identical. In fact, there are at least six different clinical trajectories that concussions may take.TREATMENT:  THE MOST IMPORTANT THING IS REST AS SOON AS POSSIBLE AFTER INJURY SO THAT THE BRAIN CAN RECOVER. COMPLETE PHYSICAL AND MENTAL REST ARE NEEDED INITIALLY.   THAT MEANS: NO SCHOOL OR WORK FOR AT LEAST 3 DAYS AND CLEARED BY YOUR DOCTOR NO MENTAL EXERTION, MEANING NO WORK, NO HOMEWORK, NO TEST TAKING.  Avoid situations with loud noise, bright lights, or crowds. However, this doesn't mean isolate yourself in a dark room for a week. Too much isolation and boredom can be harmful, contributing to feelings of anxiety, depression, and resulting in increased recovery time. Spend time with friends and family, but monitor your symptoms and avoid situations that make you feel worse.   NO VIDEO GAMES,  NO USING THE COMPUTER, NO TEXTING, NO USING SMARTPHONES, NO USE OF AN IPAD OR TABLET. DO NOT GO TO A MOVIE THEATRE OR WATCH SPORTS ON TV. HDTV TENDS TO MAKE PEOPLE FEEL WORSE.   ON APPROXIMATELY DAY 4, BEGIN VERY LIGHT EXERCISE SUCH AS WALKING. DO NOT START ANY STRENUOUS EXERCISE.   You will be given return to school or return to work recommendations.      Signs and Symptoms of Concussion:  The signs and symptoms of a concussion are incredibly important because a concussion doesn't show up on imaging like an x-ray, CT, or MRI scan and there is no objective test, like a blood or saliva test, that can determine if a patient has a concussion. A doctor makes a concussion diagnosis based on the results of a comprehensive examination, which includes observing signs of concussion and patients reporting symptoms of concussion appearing after an impact to the head or body. Concussion signs and symptoms are the brain's way of showing it is injured and not functioning normally.  CONCUSSION SIGNS  Concussion signs are what someone could observe about you to determine if you have a concussion.   Common concussion signs include:  Loss of consciousness Problems with balance Glazed look in the eyes Amnesia Delayed response to questions Forgetting an instruction, confusion about an assignment or position, or  confusion of the game, score, or opponent Inappropriate crying Inappropriate laughter Vomiting  CONCUSSION SYMPTOMS  Concussion symptoms are what someone who is concussed will tell you that they are experiencing. Concussion symptoms typically fall into six major categories:  1-  Somatic (Physical) Symptoms  Headache Light-headedness Dizziness Nausea Sensitivity to light Sensitivity to noise  2- Cognitive Symptoms  Difficulties with attention Memory problems Loss of focus Difficulty multitasking Difficulty completing mental tasks  3- Sleep Symptoms  Sleeping more than  usual Sleeping less than usual Having trouble falling asleep  4- Emotional Symptoms  Anxiety Depression Panic attacks  5 - Vestibular Symptoms  The vestibular system is affected in nearly 60% of youth and adolescent athletes following a concussion.  But what is the vestibular system?  It's the sensory system that helps with your sense of balance and spatial orientation. Think of a gyroscope! With help from your inner ears, your vestibular system detects the motion or position of your head in space. It sends information to your brain that's needed for balance and stable vision.  Need an example? If you're moving and looking at moving objects at the same time (think riding in a car), you're able to stay focused and not lose visual clarity. During a vestibular concussion, your gyroscope isn't working at full potential.  Difficulty with balance Dizziness. It may feel like the room is spinning or a slow, wavy sensation. (Like you're on a boat!) Trouble stabilizing vision when moving your head. (We call this Vestibular-Ocular Reflux, or VOR.) Think back to riding in a car. With a vestibular concussion, you can't stay focused. (The technical name for this is Visual Motion Sensitivity, or VMS.) Triggers  These 3 things could bring on vestibular symptoms:  Dynamic movements Busy environments, like the grocery store Crowds  6 - Oculo-motor  A concussion that affects the ocular, or visual, system of the brain. Typically, patients with ocular-motor concussions report pressure headaches in the front of their head, feeling more tired than normal, and becoming more symptomatic doing math or science exercises at school.  Patients also experience difficulties with their eyes working together. Follow along to explore some of the most common symptoms.  Convergence  The eyes converge when viewing objects up close, such as with reading. With convergence problems, patients may see a double  image as a target moves closer to them. Typically, without a concussion, objects can be brought very close without doubling.  Accommodation  Accommodation problems cause an object to become blurry as it is viewed up close. Accommodative and convergence problems are often experienced together and can impact reading and other near-vision activities.  Pursuits and Saccades  The eyes use pursuit eye movements to follow objects; while saccade eyes movements allow the eyes to shift rapidly from one object to another. Tracking objects, reading a book, scrolling on a computer or even watching for a moving car while crossing the street can be difficult when patients have problems with pursuit and saccade eye movements.  Misalignment  When people with eye misalignment (one eye drifts, eyes aren't perfectly aligned) sustain a concussion, the brain may have difficulty compensating for the misalignment like it once did. This can result in blurry vision, difficulty taking notes in class and focusing on the chalkboard.  Note: This is not an exhaustive list of concussion signs and symptoms, and it may take a few days for concussion symptoms to appear after the initial injury.
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
## 2019-08-19 ENCOUNTER — Telehealth: Payer: Self-pay | Admitting: Family Medicine

## 2019-08-19 NOTE — Telephone Encounter (Signed)
FMLA paperwork placed in Dr. Rometta Emery basket

## 2019-08-20 NOTE — Telephone Encounter (Signed)
FMLA paperwork faxed. Copy left up front.

## 2019-08-20 NOTE — Telephone Encounter (Signed)
In outbox

## 2019-08-20 NOTE — Telephone Encounter (Signed)
[place] paperwork faxed.

## 2019-08-27 ENCOUNTER — Encounter: Payer: Self-pay | Admitting: Family Medicine

## 2019-08-27 ENCOUNTER — Other Ambulatory Visit: Payer: Self-pay

## 2019-08-27 ENCOUNTER — Ambulatory Visit (INDEPENDENT_AMBULATORY_CARE_PROVIDER_SITE_OTHER): Payer: 59 | Admitting: Family Medicine

## 2019-08-27 VITALS — BP 110/66 | HR 84 | Temp 97.7°F | Ht 66.0 in | Wt 159.5 lb

## 2019-08-27 DIAGNOSIS — S060X0A Concussion without loss of consciousness, initial encounter: Secondary | ICD-10-CM | POA: Diagnosis not present
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
## 2019-08-28 NOTE — Telephone Encounter (Signed)
Dates updated for [facility] and [employer] paperwork. Paperwork placed in provider basket.

## 2019-09-10 ENCOUNTER — Encounter: Payer: Self-pay | Admitting: Family Medicine

## 2019-09-10 ENCOUNTER — Ambulatory Visit (INDEPENDENT_AMBULATORY_CARE_PROVIDER_SITE_OTHER): Payer: 59 | Admitting: Family Medicine

## 2019-09-10 ENCOUNTER — Other Ambulatory Visit: Payer: Self-pay

## 2019-09-10 VITALS — BP 100/60 | HR 116 | Temp 97.3°F | Ht 66.0 in | Wt 162.5 lb

## 2019-09-10 DIAGNOSIS — F0781 Postconcussional syndrome: Secondary | ICD-10-CM

## 2019-09-10 DIAGNOSIS — H832X9 Labyrinthine dysfunction, unspecified ear: Secondary | ICD-10-CM | POA: Diagnosis not present

## 2019-09-10 DIAGNOSIS — S060X0A Concussion without loss of consciousness, initial encounter: Secondary | ICD-10-CM | POA: Diagnosis not present
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
## 2019-09-11 ENCOUNTER — Encounter: Payer: Self-pay | Admitting: Physical Therapy

## 2019-09-11 ENCOUNTER — Other Ambulatory Visit: Payer: Self-pay

## 2019-09-11 ENCOUNTER — Ambulatory Visit: Payer: 59 | Attending: Family Medicine | Admitting: Physical Therapy

## 2019-09-11 DIAGNOSIS — R42 Dizziness and giddiness: Secondary | ICD-10-CM | POA: Diagnosis not present

## 2019-09-11 DIAGNOSIS — G4484 Primary exertional headache: Secondary | ICD-10-CM | POA: Insufficient documentation

## 2019-09-11 DIAGNOSIS — M542 Cervicalgia: Secondary | ICD-10-CM | POA: Insufficient documentation

## 2019-09-11 DIAGNOSIS — R2681 Unsteadiness on feet: Secondary | ICD-10-CM | POA: Diagnosis not present
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
## 2019-09-16 ENCOUNTER — Other Ambulatory Visit: Payer: Self-pay

## 2019-09-16 ENCOUNTER — Ambulatory Visit: Payer: 59 | Admitting: Physical Therapy

## 2019-09-16 ENCOUNTER — Encounter: Payer: Self-pay | Admitting: Physical Therapy

## 2019-09-16 DIAGNOSIS — R42 Dizziness and giddiness: Secondary | ICD-10-CM

## 2019-09-16 DIAGNOSIS — R2681 Unsteadiness on feet: Secondary | ICD-10-CM | POA: Diagnosis not present

## 2019-09-16 DIAGNOSIS — G4484 Primary exertional headache: Secondary | ICD-10-CM

## 2019-09-16 DIAGNOSIS — M542 Cervicalgia: Secondary | ICD-10-CM | POA: Diagnosis not present

## 2019-09-16 NOTE — Patient Instructions (Signed)
Gaze Stabilization: Sitting    Keeping eyes on target on wall 3 feet away, and move head side to side for 5 repetitions at a steady pace. Repeat while moving head up and down for 5 repetitions. When you can perform 5 repetitions, 2 sets without any symptoms, increase number of repetitions by 3.  Work up to 15 repetitions. Do __2-3__ sessions per day.  Copyright  VHI. All rights reserved.   Gaze Stabilization: Tip Card  1.Target must remain in focus, not blurry, and appear stationary while head is in motion. 2.Perform exercises with small head movements (45 to either side of midline). 3.Increase speed of head motion so long as target is in focus. 4.If you wear eyeglasses, be sure you can see target through lens (therapist will give specific instructions for bifocal / progressive lenses). 5.These exercises may provoke dizziness or nausea. Work through these symptoms. If too dizzy, slow head movement slightly. Rest between each exercise. 6.Exercises demand concentration; avoid distractions.   Flexibility: Upper Trapezius Stretch    Gently grasp right side of head while reaching behind back with other hand. Tilt head away until a gentle stretch is felt. Hold _30___ seconds while taking some deep breaths. Repeat __2__ times per set.  Do __2-3__ sessions per day.   Towel Head Turn   Setup Begin sitting in an upright position with a rolled towel around your neck. Hold each end of the towel with your hands.  Movement To work on turning head to the right: Pull down on the towel on the right side, turn head to the right until resistance.  Raise left hand, pull forwards and around but don't push on your jaw.  Hold 5-6 seconds and return to middle, repeat 5 times each side.  Tip Avoid bending your neck forward or backward. Only rotate your neck within a pain-free range of motion, and make sure to move slowly.
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
## 2019-09-28 ENCOUNTER — Ambulatory Visit: Payer: 59 | Admitting: Family Medicine

## 2019-09-28 ENCOUNTER — Other Ambulatory Visit: Payer: Self-pay

## 2019-09-28 ENCOUNTER — Ambulatory Visit: Payer: 59

## 2019-09-28 DIAGNOSIS — M542 Cervicalgia: Secondary | ICD-10-CM | POA: Diagnosis not present

## 2019-09-28 DIAGNOSIS — G4484 Primary exertional headache: Secondary | ICD-10-CM | POA: Diagnosis not present

## 2019-09-28 DIAGNOSIS — R2681 Unsteadiness on feet: Secondary | ICD-10-CM

## 2019-09-28 DIAGNOSIS — R42 Dizziness and giddiness: Secondary | ICD-10-CM
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
## 2019-09-30 ENCOUNTER — Encounter: Payer: Self-pay | Admitting: Family Medicine

## 2019-09-30 ENCOUNTER — Ambulatory Visit: Payer: 59 | Admitting: Family Medicine

## 2019-09-30 ENCOUNTER — Other Ambulatory Visit: Payer: Self-pay

## 2019-09-30 ENCOUNTER — Ambulatory Visit (INDEPENDENT_AMBULATORY_CARE_PROVIDER_SITE_OTHER): Payer: 59 | Admitting: Family Medicine

## 2019-09-30 VITALS — BP 120/80 | HR 84 | Temp 98.0°F | Ht 66.0 in | Wt 163.5 lb

## 2019-09-30 DIAGNOSIS — R41840 Attention and concentration deficit: Secondary | ICD-10-CM | POA: Diagnosis not present

## 2019-09-30 DIAGNOSIS — F4321 Adjustment disorder with depressed mood: Secondary | ICD-10-CM | POA: Diagnosis not present

## 2019-09-30 DIAGNOSIS — R410 Disorientation, unspecified: Secondary | ICD-10-CM

## 2019-09-30 DIAGNOSIS — F0781 Postconcussional syndrome: Secondary | ICD-10-CM | POA: Diagnosis not present
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
## 2019-10-04 ENCOUNTER — Ambulatory Visit (INDEPENDENT_AMBULATORY_CARE_PROVIDER_SITE_OTHER): Payer: 59

## 2019-10-04 ENCOUNTER — Other Ambulatory Visit: Payer: Self-pay

## 2019-10-04 DIAGNOSIS — R41840 Attention and concentration deficit: Secondary | ICD-10-CM | POA: Diagnosis not present

## 2019-10-04 DIAGNOSIS — R4182 Altered mental status, unspecified: Secondary | ICD-10-CM | POA: Diagnosis not present

## 2019-10-04 DIAGNOSIS — F0781 Postconcussional syndrome: Secondary | ICD-10-CM | POA: Diagnosis not present

## 2019-10-04 DIAGNOSIS — R410 Disorientation, unspecified: Secondary | ICD-10-CM | POA: Diagnosis not present

## 2019-10-06 ENCOUNTER — Other Ambulatory Visit: Payer: Self-pay

## 2019-10-06 ENCOUNTER — Ambulatory Visit: Payer: 59 | Attending: Family Medicine | Admitting: Physical Therapy

## 2019-10-06 DIAGNOSIS — M542 Cervicalgia: Secondary | ICD-10-CM | POA: Insufficient documentation

## 2019-10-06 DIAGNOSIS — G4484 Primary exertional headache: Secondary | ICD-10-CM | POA: Insufficient documentation

## 2019-10-06 DIAGNOSIS — R2681 Unsteadiness on feet: Secondary | ICD-10-CM | POA: Insufficient documentation

## 2019-10-06 DIAGNOSIS — R42 Dizziness and giddiness: Secondary | ICD-10-CM | POA: Diagnosis not present

## 2019-10-07 ENCOUNTER — Encounter: Payer: Self-pay | Admitting: Physical Therapy
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
## 2019-10-08 ENCOUNTER — Other Ambulatory Visit: Payer: Self-pay

## 2019-10-08 ENCOUNTER — Ambulatory Visit: Payer: 59 | Admitting: Physical Therapy

## 2019-10-08 DIAGNOSIS — M542 Cervicalgia: Secondary | ICD-10-CM | POA: Diagnosis not present

## 2019-10-08 DIAGNOSIS — R42 Dizziness and giddiness: Secondary | ICD-10-CM | POA: Diagnosis not present

## 2019-10-08 DIAGNOSIS — R2681 Unsteadiness on feet: Secondary | ICD-10-CM | POA: Diagnosis not present

## 2019-10-08 DIAGNOSIS — G4484 Primary exertional headache: Secondary | ICD-10-CM | POA: Diagnosis not present

## 2019-10-09 ENCOUNTER — Encounter: Payer: Self-pay | Admitting: Physical Therapy
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
## 2019-10-13 ENCOUNTER — Ambulatory Visit: Payer: 59 | Admitting: Physical Therapy

## 2019-10-13 ENCOUNTER — Other Ambulatory Visit: Payer: Self-pay

## 2019-10-13 DIAGNOSIS — R42 Dizziness and giddiness: Secondary | ICD-10-CM

## 2019-10-13 DIAGNOSIS — G4484 Primary exertional headache: Secondary | ICD-10-CM | POA: Diagnosis not present

## 2019-10-13 DIAGNOSIS — R2681 Unsteadiness on feet: Secondary | ICD-10-CM

## 2019-10-13 DIAGNOSIS — M542 Cervicalgia: Secondary | ICD-10-CM | POA: Diagnosis not present
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
## 2019-10-15 ENCOUNTER — Other Ambulatory Visit: Payer: Self-pay

## 2019-10-15 ENCOUNTER — Encounter: Payer: Self-pay | Admitting: Physical Therapy

## 2019-10-15 ENCOUNTER — Ambulatory Visit: Payer: 59 | Admitting: Physical Therapy

## 2019-10-15 DIAGNOSIS — R2681 Unsteadiness on feet: Secondary | ICD-10-CM | POA: Diagnosis not present

## 2019-10-15 DIAGNOSIS — G4484 Primary exertional headache: Secondary | ICD-10-CM

## 2019-10-15 DIAGNOSIS — M542 Cervicalgia: Secondary | ICD-10-CM

## 2019-10-15 DIAGNOSIS — R42 Dizziness and giddiness: Secondary | ICD-10-CM
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
## 2019-10-15 NOTE — Patient Instructions (Signed)
Gaze Stabilization: Sitting    Keeping eyes on target on wall 3 feet away, and move head side to side for 5 repetitions at a steady pace. Repeat while moving head up and down for 5 repetitions. When you can perform 5 repetitions, 2 sets without any symptoms, increase number of repetitions by 3.  Work up to 15 repetitions. Do __2-3__ sessions per day.  Copyright  VHI. All rights reserved.   Gaze Stabilization: Tip Card  1.Target must remain in focus, not blurry, and appear stationary while head is in motion. 2.Perform exercises with small head movements (45 to either side of midline). 3.Increase speed of head motion so long as target is in focus. 4.If you wear eyeglasses, be sure you can see target through lens (therapist will give specific instructions for bifocal / progressive lenses). 5.These exercises may provoke dizziness or nausea. Work through these symptoms. If too dizzy, slow head movement slightly. Rest between each exercise. 6.Exercises demand concentration; avoid distractions.   Flexibility: Upper Trapezius Stretch    Gently grasp right side of head while reaching behind back with other hand. Tilt head away until a gentle stretch is felt. Hold _30___ seconds while taking some deep breaths. Repeat __2__ times per set.  Do __2-3__ sessions per day.   Towel Head Turn   Setup Begin sitting in an upright position with a rolled towel around your neck. Hold each end of the towel with your hands.  Movement To work on turning head to the right: Pull down on the towel on the right side, turn head to the right until resistance.  Raise left hand, pull forwards and around but don't push on your jaw.  Hold 5-6 seconds and return to middle, repeat 5 times each side.  Tip Avoid bending your neck forward or backward. Only rotate your neck within a pain-free range of motion, and make sure to move slowly.    Treadmill Program - whatever speed the treadmill goes to.  Incline 1-2%.   Time: 8 minutes for now.  Keeping RPE around 12/13 light to moderate exertion and symptoms between 2-3.  If they are a 4, back down the incline or stop if they go above a 4.

## 2019-10-16 ENCOUNTER — Ambulatory Visit: Payer: 59 | Admitting: Neurology

## 2019-10-16 ENCOUNTER — Encounter: Payer: Self-pay | Admitting: Neurology

## 2019-10-16 VITALS — BP 110/76 | HR 89 | Ht 65.0 in | Wt 163.5 lb

## 2019-10-16 DIAGNOSIS — F0781 Postconcussional syndrome: Secondary | ICD-10-CM | POA: Insufficient documentation

## 2019-10-16 DIAGNOSIS — G3184 Mild cognitive impairment, so stated: Secondary | ICD-10-CM | POA: Diagnosis not present

## 2019-10-16 DIAGNOSIS — S060X0A Concussion without loss of consciousness, initial encounter: Secondary | ICD-10-CM

## 2019-10-16 DIAGNOSIS — R292 Abnormal reflex: Secondary | ICD-10-CM | POA: Diagnosis not present

## 2019-10-16 DIAGNOSIS — H9313 Tinnitus, bilateral: Secondary | ICD-10-CM | POA: Insufficient documentation

## 2019-10-16 DIAGNOSIS — H9311 Tinnitus, right ear: Secondary | ICD-10-CM | POA: Insufficient documentation

## 2019-10-16 MED ORDER — DONEPEZIL HCL 5 MG PO TABS: 5.0000 mg | ORAL_TABLET | Freq: Two times a day (BID) | ORAL | 2 refills | Status: DC
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
## 2019-10-16 NOTE — Patient Instructions (Signed)
Neck Contusion A neck contusion is a deep bruise in the neck. It is caused by a direct force (blunt trauma) to the neck. This type of neck injury is dangerous because it could affect the important structures in your neck, including:  Neck muscles.  Large blood vessels (carotid arteries and jugular veins).  Nerves.  The airway. This includes the voice box (larynx) and the windpipe (trachea).  The tube that lets you swallow (esophagus). A neck contusion can cause swelling and bleeding in your neck that can press on your throat and larynx. This can narrow your airway and cause difficulty with breathing (respiratory distress). What are the causes? This condition may be caused by:  Motor vehicle collisions that cause: ? Blunt trauma to the neck. ? Extreme and sudden twisting motion of the head (whiplash).  Contact sports injuries.  Bicycle injuries.  Assault injuries, including choking (strangulation). What are the signs or symptoms? Symptoms of this condition include:  Pain.  Swelling.  Bruising or stiffness.  Blood accumulation inside the neck (hematoma). Other symptoms depend on what structures are affected. A contusion that affects a carotid artery may cause an expanding lump in the neck, as well as:  Dizziness.  Decreasing consciousness.  Weakness on the side of the body that is opposite from the contusion. A contusion that affects the airway may cause:  Difficulty with breathing.  Noisy breathing (stridor).  A hoarse or weak voice.  Coughing up blood. A contusion that affects the esophagus may cause:  Difficulty with swallowing.  Spitting up blood. How is this diagnosed? This condition is diagnosed based on:  A physical exam.  Your symptoms.  Your history of blunt trauma. You may also have tests to help rule out a more serious injury. Tests may include:  X-ray.  CT scan.  MRI.  Angiography. How is this treated? In most cases, an  uncomplicated neck contusion can be treated with home care. This includes rest, ice, and over-the-counter pain medicine, such as NSAIDs. Other treatment depends on possible complications that you may have. Respiratory distress is the most dangerous complication of neck contusion. This is a medical emergency that requires immediate treatment. Treatment may include having:  A breathing tube inserted into your larynx (endotracheal intubation).  An emergency procedure to create a hole in your larynx or trachea for a breathing tube (tracheotomy or cricothyrotomy).  Surgery to repair any tears.  Drainage of a hematoma in your neck. Follow these instructions at home: Managing pain, stiffness, and swelling   If directed, put ice on the injured area: ? Put ice in a plastic bag. ? Place a towel between your skin and the bag. ? Leave the ice on for 20 minutes, 2-3 times a day. General instructions   Take over-the-counter and prescription medicines only as told by your health care provider.  Rest at home until you have less pain and swelling. Ask your health care provider when you can return to normal activity.  Keep your head and neck at least partially raised (elevated) above the level of your heart until you heal. Do this even when you sleep.  If you were given a soft cervical collar, wear it as told by your health care provider. Do not continue to wear the collar for longer than recommended by your health care provider.  Follow instructions from your health care provider about what you can or cannot eat. Often, only fluids and soft foods are recommended until you heal.  Keep all follow-up visits  as told by your health care provider. This is important. Contact a health care provider if:  Your pain does not get better in 2-3 days.  You develop increasing pain or difficulty with swallowing. Get help right away if:  You suddenly have difficulty breathing.  You have noisy  breathing.  You cough up blood.  You cannot swallow.  You develop a drooping face, sudden weakness on one side of your body, difficulty speaking, or difficulty understanding speech. These symptoms may represent a serious problem that is an emergency. Do not wait to see if the symptoms will go away. Get medical help right away. Call your local emergency services (911 in the [place] Do not drive yourself to the hospital. Summary  A neck contusion is a deep bruise in the neck. It is caused by a direct force (blunt trauma) to the neck.  This type of neck injury is dangerous because it could affect the important structures in your neck.  Take over-the-counter and prescription medicines only as told by your health care provider.  Rest and keep your head and neck at least partially raised (elevated) above the level of your heart until you heal. Do this even when you sleep.  Get help right away if you have difficulty breathing, cough up blood, cannot swallow, or have other new or worsening symptoms. This information is not intended to replace advice given to you by your health care provider. Make sure you discuss any questions you have with your health care provider. Document Revised: 02/12/2019 Document Reviewed: 01/02/2018 Elsevier Patient Education  [place], Adult  A concussion is a brain injury from a hard, direct hit (trauma) to your head or body. This direct hit causes the brain to quickly shake back and forth inside the skull. A concussion may also be called a mild traumatic brain injury (TBI). Healing from this injury can take time. What are the causes? This condition is caused by:  A direct hit to your head, such as: ? Running into a player during a game. ? Being hit in a fight. ? Hitting your head on a hard surface.  A quick and sudden movement (jolt) of the head or neck, such as in a car crash. What are the signs or symptoms? The signs of a concussion can  be hard to notice. They may be missed by you, family members, and doctors. You may look fine on the outside but may not act or feel normal. Physical symptoms  Headaches.  Being tired (fatigued).  Being dizzy.  Problems with body balance.  Problems seeing or hearing.  Being sensitive to light or noise.  Feeling sick to your stomach (nausea) or throwing up (vomiting).  Not sleeping or eating as you used to.  Loss of feeling (numbness) or tingling in the body.  Seizure. Mental and emotional symptoms  Problems remembering things.  Trouble focusing your mind (concentrating), organizing, or making decisions.  Being slow to think, act, react, speak, or read.  Feeling grouchy (irritable).  Having mood changes.  Feeling worried or nervous (anxious).  Feeling sad (depressed). How is this treated? This condition may be treated by:  Stopping sports or activity if you are injured. If you hit your head or have signs of concussion: ? Do not return to sports or activities the same day. ? Get checked by a doctor before you return to your activities.  Resting your body and your mind.  Being watched carefully, often at home.  Medicines to  help with symptoms such as: ? Feeling sick to your stomach. ? Headaches. ? Problems with sleep.  Avoid taking strong pain medicines (opioids) for a concussion.  Avoiding alcohol and drugs.  Being asked to go to a concussion clinic or a place to help you recover (rehabilitation center). Recovery from a concussion can take time. Return to activities only:  When you are fully healed.  When your doctor says it is safe. Follow these instructions at home: Activity  Limit activities that need a lot of thought or focus, such as: ? Homework or work for your job. ? Watching TV. ? Using the computer or phone. ? Playing memory games and puzzles.  Rest. Rest helps your brain heal. Make sure you: ? Get plenty of sleep. Most adults should get  7-9 hours of sleep each night. ? Rest during the day. Take naps or breaks when you feel tired.  Avoid activity like exercise until your doctor says its safe. Stop any activity that makes symptoms worse.  Do not do activities that could cause a second concussion, such as riding a bike or playing sports.  Ask your doctor when you can return to your normal activities, such as school, work, sports, and driving. Your ability to react may be slower. Do not do these activities if you are dizzy. General instructions   Take over-the-counter and prescription medicines only as told by your doctor.  Do not drink alcohol until your doctor says you can.  Watch your symptoms and tell other people to do the same. Other problems can occur after a concussion. Older adults have a higher risk of serious problems.  Tell your work Freight forwarder, teachers, Government social research officer, school counselor, coach, or Product/process development scientist about your injury and symptoms. Tell them about what you can or cannot do.  Keep all follow-up visits as told by your doctor. This is important. How is this prevented?  It is very important that you do not get another brain injury. In rare cases, another injury can cause brain damage that will not go away, brain swelling, or death. The risk of this is greatest in the first 7-10 days after a head injury. To avoid injuries: ? Stop activities that could lead to a second concussion, such as contact sports, until your doctor says it is okay. ? When you return to sports or activities:  Do not crash into other players. This is how most concussions happen.  Follow the rules.  Respect other players. ? Get regular exercise. Do strength and balance training. ? Wear a helmet that fits you well during sports, biking, or other activities.  Helmets can help protect you from serious skull and brain injuries, but they do not protect you from a concussion. Even when wearing a helmet, you should avoid being hit in the  head. Contact a doctor if:  Your symptoms get worse or they do not get better.  You have new symptoms.  You have another injury. Get help right away if:  You have bad headaches or your headaches get worse.  You feel weak or numb in any part of your body.  You are mixed up (confused).  Your balance gets worse.  You keep throwing up.  You feel more sleepy than normal.  Your speech is not clear (is slurred).  You cannot recognize people or places.  You have a seizure.  Others have trouble waking you up.  You have behavior changes.  You have changes in how you see (vision).  You pass out (lose consciousness). Summary  A concussion is a brain injury from a hard, direct hit (trauma) to your head or body.  This condition is treated with rest and careful watching of symptoms.  If you keep having symptoms, call your doctor. This information is not intended to replace advice given to you by your health care provider. Make sure you discuss any questions you have with your health care provider. Document Revised: 12/12/2017 Document Reviewed: 12/12/2017 Elsevier Patient Education  [place].

## 2019-10-19 ENCOUNTER — Telehealth: Payer: Self-pay | Admitting: Neurology

## 2019-10-19 NOTE — Telephone Encounter (Signed)
[facility] auth: NPR patient is scheduled at GI for 11/19/19.

## 2019-10-20 ENCOUNTER — Ambulatory Visit: Payer: 59 | Admitting: Physical Therapy

## 2019-10-20 ENCOUNTER — Other Ambulatory Visit: Payer: Self-pay

## 2019-10-20 DIAGNOSIS — G4484 Primary exertional headache: Secondary | ICD-10-CM | POA: Diagnosis not present

## 2019-10-20 DIAGNOSIS — M542 Cervicalgia: Secondary | ICD-10-CM | POA: Diagnosis not present

## 2019-10-20 DIAGNOSIS — R42 Dizziness and giddiness: Secondary | ICD-10-CM | POA: Diagnosis not present

## 2019-10-20 DIAGNOSIS — R2681 Unsteadiness on feet: Secondary | ICD-10-CM

## 2019-10-21 ENCOUNTER — Ambulatory Visit (INDEPENDENT_AMBULATORY_CARE_PROVIDER_SITE_OTHER): Payer: 59 | Admitting: Family Medicine

## 2019-10-21 ENCOUNTER — Encounter: Payer: Self-pay | Admitting: Physical Therapy

## 2019-10-21 ENCOUNTER — Encounter: Payer: Self-pay | Admitting: Family Medicine

## 2019-10-21 VITALS — BP 120/72 | HR 102 | Temp 97.8°F | Ht 65.0 in | Wt 164.2 lb

## 2019-10-21 DIAGNOSIS — F0781 Postconcussional syndrome: Secondary | ICD-10-CM

## 2019-10-21 DIAGNOSIS — S060X0S Concussion without loss of consciousness, sequela: Secondary | ICD-10-CM | POA: Diagnosis not present

## 2019-10-21 NOTE — Patient Instructions (Addendum)
Get Abby to help you with your phone.  Set up the phone to have a more "warm" background You can look it up to make it easier on eyes for patient with concussion.  Blow up the letters on your phone. Bold text.  (Iphone / kindle [employer], also near-blindness setting)  Very short drive with [place] - less than a mile.
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
## 2019-10-22 ENCOUNTER — Other Ambulatory Visit: Payer: Self-pay

## 2019-10-22 ENCOUNTER — Encounter: Payer: Self-pay | Admitting: Physical Therapy

## 2019-10-22 ENCOUNTER — Ambulatory Visit: Payer: 59 | Admitting: Physical Therapy

## 2019-10-22 DIAGNOSIS — R2681 Unsteadiness on feet: Secondary | ICD-10-CM | POA: Diagnosis not present

## 2019-10-22 DIAGNOSIS — G4484 Primary exertional headache: Secondary | ICD-10-CM

## 2019-10-22 DIAGNOSIS — M542 Cervicalgia: Secondary | ICD-10-CM | POA: Diagnosis not present

## 2019-10-22 DIAGNOSIS — R42 Dizziness and giddiness: Secondary | ICD-10-CM
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
## 2019-10-27 ENCOUNTER — Ambulatory Visit: Payer: 59 | Admitting: Physical Therapy

## 2019-10-27 ENCOUNTER — Telehealth: Payer: Self-pay

## 2019-10-27 NOTE — Telephone Encounter (Signed)
Patient contacted the office and states she needs last OV note faxed to [name] at [phone]. She states it must have a cover sheet with her name, DOB, & [place] listed on it.

## 2019-10-28 NOTE — Telephone Encounter (Signed)
This has been faxed.

## 2019-10-29 ENCOUNTER — Ambulatory Visit: Payer: 59 | Admitting: Physical Therapy

## 2019-11-11 ENCOUNTER — Other Ambulatory Visit: Payer: Self-pay

## 2019-11-11 ENCOUNTER — Encounter: Payer: Self-pay | Admitting: Physical Therapy

## 2019-11-11 ENCOUNTER — Encounter: Payer: Self-pay | Admitting: Family Medicine

## 2019-11-11 ENCOUNTER — Ambulatory Visit: Payer: 59 | Attending: Family Medicine | Admitting: Physical Therapy

## 2019-11-11 ENCOUNTER — Ambulatory Visit: Payer: 59 | Admitting: Family Medicine

## 2019-11-11 ENCOUNTER — Other Ambulatory Visit (INDEPENDENT_AMBULATORY_CARE_PROVIDER_SITE_OTHER): Payer: Self-pay

## 2019-11-11 VITALS — BP 110/60 | HR 100 | Temp 97.6°F | Ht 65.0 in | Wt 163.5 lb

## 2019-11-11 DIAGNOSIS — F0781 Postconcussional syndrome: Secondary | ICD-10-CM | POA: Diagnosis not present

## 2019-11-11 DIAGNOSIS — S060X0S Concussion without loss of consciousness, sequela: Secondary | ICD-10-CM

## 2019-11-11 DIAGNOSIS — R42 Dizziness and giddiness: Secondary | ICD-10-CM | POA: Diagnosis not present

## 2019-11-11 DIAGNOSIS — M542 Cervicalgia: Secondary | ICD-10-CM | POA: Insufficient documentation

## 2019-11-11 DIAGNOSIS — R2681 Unsteadiness on feet: Secondary | ICD-10-CM

## 2019-11-11 DIAGNOSIS — S060X0A Concussion without loss of consciousness, initial encounter: Secondary | ICD-10-CM | POA: Diagnosis not present

## 2019-11-11 DIAGNOSIS — G4484 Primary exertional headache: Secondary | ICD-10-CM

## 2019-11-11 DIAGNOSIS — H9313 Tinnitus, bilateral: Secondary | ICD-10-CM | POA: Diagnosis not present

## 2019-11-11 DIAGNOSIS — Z0289 Encounter for other administrative examinations: Secondary | ICD-10-CM
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
## 2019-11-11 NOTE — Patient Instructions (Signed)
Gaze Stabilization: Sitting  Gaze Stabilization - Tip Card  1.Target must remain in focus, not blurry, and appear stationary while head is in motion. 2.Perform exercises with small head movements (45 to either side of midline). 3.Increase speed of head motion so long as target is in focus. 4.If you wear eyeglasses, be sure you can see target through lens (therapist will give specific instructions for bifocal / progressive lenses). 5.These exercises may provoke dizziness or nausea. Work through these symptoms. If too dizzy, slow head movement slightly. Rest between each exercise. 6.Exercises demand concentration; avoid distractions. 7.For safety, perform standing exercises close to a counter, wall, corner, or next to someone.  Copyright  VHI. All rights reserved.   Gaze Stabilization - Standing Feet Apart   Feet shoulder width apart, keeping eyes on target on wall 3 feet away, tilt head down slightly and move head side to side for 15 seconds. Repeat while moving head up and down for 15 seconds. *Work up to tolerating 30 seconds, as able. Do 2-3 sessions per day.   Flexibility: Upper Trapezius Stretch    Gently grasp right side of head while reaching behind back with other hand. Tilt head away until a gentle stretch is felt. Hold _30___ seconds while taking some deep breaths. Repeat __2__ times per set.  Do __2-3__ sessions per day.   Towel Head Turn   Setup Begin sitting in an upright position with a rolled towel around your neck. Hold each end of the towel with your hands.  Movement To work on turning head to the right: Pull down on the towel on the right side, turn head to the right until resistance.  Raise left hand, pull forwards and around but don't push on your jaw.  Hold 5-6 seconds and return to middle, repeat 5 times each side.  Tip Avoid bending your neck forward or backward. Only rotate your neck within a pain-free range of motion, and make sure to move  slowly.   Treadmill Program - whatever speed the treadmill goes to.  Incline 1-2%.  Time: 8 minutes for now.  Keeping RPE around 12/13 light to moderate exertion and symptoms between 2-3.  If they are a 4, back down the incline or stop if they go above a 4.
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
## 2019-11-12 ENCOUNTER — Encounter: Payer: Self-pay | Admitting: Neurology

## 2019-11-12 LAB — COMPREHENSIVE METABOLIC PANEL
ALT: 37 IU/L — ABNORMAL HIGH (ref 0–32)
AST: 20 IU/L (ref 0–40)
Albumin/Globulin Ratio: 1.7 (ref 1.2–2.2)
Albumin: 4.5 g/dL (ref 3.8–4.9)
Alkaline Phosphatase: 105 IU/L (ref 48–121)
BUN/Creatinine Ratio: 14 (ref 9–23)
BUN: 12 mg/dL (ref 6–24)
Bilirubin Total: 0.4 mg/dL (ref 0.0–1.2)
CO2: 24 mmol/L (ref 20–29)
Calcium: 9.4 mg/dL (ref 8.7–10.2)
Chloride: 104 mmol/L (ref 96–106)
Creatinine, Ser: 0.86 mg/dL (ref 0.57–1.00)
GFR calc Af Amer: 87 mL/min/{1.73_m2} (ref >59)
GFR calc non Af Amer: 75 mL/min/{1.73_m2} (ref >59)
Globulin, Total: 2.7 g/dL (ref 1.5–4.5)
Glucose: 88 mg/dL (ref 65–99)
Potassium: 4.4 mmol/L (ref 3.5–5.2)
Sodium: 141 mmol/L (ref 134–144)
Total Protein: 7.2 g/dL (ref 6.0–8.5)

## 2019-11-12 NOTE — Progress Notes (Signed)
Mildly elevated ALT- this result has been variable over the last 10 years.  Normal GFR and electrolytes.

## 2019-11-18 ENCOUNTER — Ambulatory Visit: Payer: 59 | Admitting: Physical Therapy

## 2019-11-18 ENCOUNTER — Other Ambulatory Visit: Payer: Self-pay

## 2019-11-18 ENCOUNTER — Encounter: Payer: Self-pay | Admitting: Physical Therapy

## 2019-11-18 DIAGNOSIS — M542 Cervicalgia: Secondary | ICD-10-CM | POA: Diagnosis not present

## 2019-11-18 DIAGNOSIS — G4484 Primary exertional headache: Secondary | ICD-10-CM | POA: Diagnosis not present

## 2019-11-18 DIAGNOSIS — R42 Dizziness and giddiness: Secondary | ICD-10-CM | POA: Diagnosis not present

## 2019-11-18 DIAGNOSIS — R2681 Unsteadiness on feet: Secondary | ICD-10-CM | POA: Diagnosis not present

## 2019-11-19 ENCOUNTER — Ambulatory Visit
Admission: RE | Admit: 2019-11-19 | Discharge: 2019-11-19 | Disposition: A | Discharge status: Home / Self Care | Payer: 59 | Source: Ambulatory Visit | Attending: Neurology | Admitting: Neurology

## 2019-11-19 DIAGNOSIS — F0781 Postconcussional syndrome: Secondary | ICD-10-CM

## 2019-11-19 DIAGNOSIS — H9313 Tinnitus, bilateral: Secondary | ICD-10-CM | POA: Diagnosis not present

## 2019-11-19 DIAGNOSIS — S060X0A Concussion without loss of consciousness, initial encounter: Secondary | ICD-10-CM

## 2019-11-19 MED ORDER — GADOBENATE DIMEGLUMINE 529 MG/ML IV SOLN
15.0000 mL | Freq: Once | INTRAVENOUS | Status: AC | PRN
  Administered 2019-11-19: 15 mL via INTRAVENOUS

## 2019-11-19 NOTE — Progress Notes (Signed)
Thank-you for giving me the update.  You will always seen patients in PT more frequently than MD appointments, so your input is invaluable.

## 2019-11-19 NOTE — Therapy (Addendum)
[facility] [address] [place], [place], [address] Phone: [phone]   Fax:  [phone]  Patient Details  Name: [name] MRN: [id number] Date of Birth: 1960/08/02 Referring Provider:  Owens Loffler, MD  Encounter Date: 11/18/2019  [name]. [place] [address]   TO WHOM IT MAY CONCERN:  [name] continues to exhibit the following symptoms after working a partial work day (until [employer]) with breaks every 30 minutes: headache, fatigue, sensitivity to noise, and sleeping more than normal - pt reports taking a 4 hour nap after working.  Based on the He[employer] Concussion Return to Work Algorithm, [name] may benefit from further accommodations and modifications to her work day to ensure successful return to to pre-injury work duties with lower symptom burden.    The following accommodations and modifications are being requested:  1. More gradual progression of length of work day - increase work day by one hour every 3-5 days based on symptoms with gradual progression of time between breaks (from 30 minutes > 45 > 60 minutes).  2. Minimize environmental factors that [name] is sensitive to - noise sensitivity - reduce background noise by having other staff turn down or turn off music, especially during busier patient hours.  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    3. Encourage [name] to take short walks outside during breaks.   If you have any questions or concerns, please don't hesitate to contact me.  Sincerely, Rico Junker, PT, DPT 11/19/19    9:17 AM                    [place] [address] [place], [place], [address] Phone: [phone]   Fax:  [phone]
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
## 2019-11-23 ENCOUNTER — Encounter: Payer: Self-pay | Admitting: Family Medicine

## 2019-11-23 ENCOUNTER — Ambulatory Visit (INDEPENDENT_AMBULATORY_CARE_PROVIDER_SITE_OTHER): Payer: 59 | Admitting: Family Medicine

## 2019-11-23 ENCOUNTER — Other Ambulatory Visit: Payer: Self-pay

## 2019-11-23 VITALS — BP 116/60 | HR 86 | Temp 97.5°F | Ht 65.0 in | Wt 165.0 lb

## 2019-11-23 DIAGNOSIS — F0781 Postconcussional syndrome: Secondary | ICD-10-CM

## 2019-11-23 DIAGNOSIS — S060X0S Concussion without loss of consciousness, sequela: Secondary | ICD-10-CM | POA: Diagnosis not present

## 2019-11-23 DIAGNOSIS — H832X9 Labyrinthine dysfunction, unspecified ear: Secondary | ICD-10-CM
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
## 2019-11-26 ENCOUNTER — Encounter: Payer: 59 | Admitting: Physical Therapy

## 2019-11-27 ENCOUNTER — Telehealth: Payer: Self-pay | Admitting: Family Medicine

## 2019-11-27 DIAGNOSIS — Z13220 Encounter for screening for lipoid disorders: Secondary | ICD-10-CM

## 2019-11-27 NOTE — Telephone Encounter (Signed)
-----   Message from Ellamae Sia sent at 11/17/2019 11:19 AM EDT ----- Regarding: Lab orders for Monday, 7.26.21 Patient is scheduled for CPX labs, please order future labs, Thanks , Karna Christmas

## 2019-11-30 ENCOUNTER — Other Ambulatory Visit: Payer: 59

## 2019-12-02 ENCOUNTER — Encounter: Payer: Self-pay | Admitting: Family Medicine

## 2019-12-02 ENCOUNTER — Other Ambulatory Visit: Payer: Self-pay

## 2019-12-02 ENCOUNTER — Ambulatory Visit (INDEPENDENT_AMBULATORY_CARE_PROVIDER_SITE_OTHER): Payer: 59 | Admitting: Family Medicine

## 2019-12-02 ENCOUNTER — Encounter: Payer: Self-pay | Admitting: Physical Therapy

## 2019-12-02 ENCOUNTER — Ambulatory Visit: Payer: 59 | Admitting: Physical Therapy

## 2019-12-02 VITALS — BP 120/70 | HR 96 | Temp 97.5°F | Ht 66.25 in | Wt 163.8 lb

## 2019-12-02 DIAGNOSIS — R7303 Prediabetes: Secondary | ICD-10-CM

## 2019-12-02 DIAGNOSIS — R2681 Unsteadiness on feet: Secondary | ICD-10-CM | POA: Diagnosis not present

## 2019-12-02 DIAGNOSIS — M542 Cervicalgia: Secondary | ICD-10-CM | POA: Diagnosis not present

## 2019-12-02 DIAGNOSIS — E559 Vitamin D deficiency, unspecified: Secondary | ICD-10-CM

## 2019-12-02 DIAGNOSIS — G4484 Primary exertional headache: Secondary | ICD-10-CM

## 2019-12-02 DIAGNOSIS — R42 Dizziness and giddiness: Secondary | ICD-10-CM | POA: Diagnosis not present

## 2019-12-02 DIAGNOSIS — Z0000 Encounter for general adult medical examination without abnormal findings: Secondary | ICD-10-CM | POA: Diagnosis not present

## 2019-12-02 NOTE — Patient Instructions (Signed)
Gauge your fatigue at the end of the day.  If 5 or 6/10 then rest; if 2 or 3/10 then try your exercises or a short walk.  Try to walk on the weekends.   Gaze Stabilization - Tip Card  1.Target must remain in focus, not blurry, and appear stationary while head is in motion. 2.Perform exercises with small head movements (45 to either side of midline). 3.Increase speed of head motion so long as target is in focus. 4.If you wear eyeglasses, be sure you can see target through lens (therapist will give specific instructions for bifocal / progressive lenses). 5.These exercises may provoke dizziness or nausea. Work through these symptoms. If too dizzy, slow head movement slightly. Rest between each exercise. 6.Exercises demand concentration; avoid distractions. 7.For safety, perform standing exercises close to a counter, wall, corner, or next to someone.  Copyright  VHI. All rights reserved.   Gaze Stabilization - Standing Feet Apart   Add a pattern background - Feet shoulder width apart, keeping eyes on target on wall 3 feet away, tilt head down slightly and move head side to side for 30 seconds. Repeat while moving head up and down for 30 seconds. *Work up to tolerating 60 seconds, as able. Do 2-3 sessions per day.   Flexibility: Upper Trapezius Stretch    Gently grasp right side of head while reaching behind back with other hand. Tilt head away until a gentle stretch is felt. Hold _30___ seconds while taking some deep breaths. Repeat __2__ times per set.  Do __2-3__ sessions per day.   Towel Head Turn   Setup Begin sitting in an upright position with a rolled towel around your neck. Hold each end of the towel with your hands.  Movement To work on turning head to the right: Pull down on the towel on the right side, turn head to the right until resistance.  Raise left hand, pull forwards and around but don't push on your jaw.  Hold 5-6 seconds and return to middle, repeat 5 times  each side.  Tip Avoid bending your neck forward or backward. Only rotate your neck within a pain-free range of motion, and make sure to move slowly.   Treadmill Program - whatever speed the treadmill goes to.  Incline 1-2%.  Time: 8 minutes for now.  Keeping RPE around 12/13 light to moderate exertion and symptoms between 2-3.  If they are a 4, back down the incline or stop if they go above a 4.
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
## 2019-12-02 NOTE — Patient Instructions (Addendum)
Set up mammogram as soon as you can. Later this year you will be due  For a colonoscopy.  Please stop at the lab to have labs drawn.

## 2019-12-03 ENCOUNTER — Encounter: Payer: Self-pay | Admitting: Family Medicine

## 2019-12-03 ENCOUNTER — Encounter: Payer: 59 | Admitting: Family Medicine

## 2019-12-04 ENCOUNTER — Ambulatory Visit: Payer: 59

## 2019-12-05 ENCOUNTER — Ambulatory Visit: Payer: 59 | Attending: Internal Medicine

## 2019-12-05 DIAGNOSIS — Z23 Encounter for immunization: Secondary | ICD-10-CM

## 2019-12-05 NOTE — Progress Notes (Signed)
   Covid-19 Vaccination Clinic  Name:  [name]    MRN: [id number] DOB: 10-09-1960  12/05/2019  [name] was observed post Covid-19 immunization for 15 minutes without incident. She was provided with Vaccine Information Sheet and instruction to access the V-Safe system.   [name] was instructed to call 911 with any severe reactions post vaccine: Marland Kitchen Difficulty breathing  . Swelling of face and throat  . A fast heartbeat  . A bad rash all over body  . Dizziness and weakness   Immunizations Administered    Name Date Dose VIS Date Route   Pfizer COVID-19 Vaccine 12/05/2019  8:56 AM 0.3 mL 07/01/2018 Intramuscular   Manufacturer: [place]   Lot: [id number]   [place]: [id number]

## 2019-12-14 ENCOUNTER — Encounter: Payer: Self-pay | Admitting: Family Medicine

## 2019-12-14 ENCOUNTER — Ambulatory Visit (INDEPENDENT_AMBULATORY_CARE_PROVIDER_SITE_OTHER): Payer: 59 | Admitting: Family Medicine

## 2019-12-14 ENCOUNTER — Other Ambulatory Visit: Payer: Self-pay

## 2019-12-14 VITALS — BP 129/80 | HR 80 | Temp 97.8°F | Ht 66.5 in | Wt 163.8 lb

## 2019-12-14 DIAGNOSIS — F0781 Postconcussional syndrome: Secondary | ICD-10-CM | POA: Diagnosis not present

## 2019-12-14 NOTE — Progress Notes (Signed)
Tyreonna Czaplicki T. Magan Winnett, MD, [place] at [facility] [place], [address]  Phone: [phone]  FAX: [phone]  CHERY GIUSTO - 59 y.o. female  MRN [id number]  Date of Birth: 02/02/1961  Date: 12/14/2019  PCP: Jinny Sanders, MD  Referral: Jinny Sanders, MD  Chief Complaint  Patient presents with  . Follow-up    Concussion    This visit occurred during the SARS-CoV-2 public health emergency.  Safety protocols were in place, including screening questions prior to the visit, additional usage of staff PPE, and extensive cleaning of exam room while observing appropriate contact time as indicated for disinfecting solutions.   Subjective:   JERIAH SKUFCA is a 59 y.o. very pleasant female patient with Body mass index is 26.03 kg/m. who presents with the following:  At this point, she is doing much better compared to her last office visit.  Her symptom score is 1 out of 22 and 1 out of 132.  She does sometimes have some headache and photophobia as well as some fogginess, but this is more when she is taxed and tired.  She has been taking a 10-minute break every 30 minutes at work and this is helped quite a bit.  She is tired at the end of the workday, but she does feel better after she has had a good night sleep, and conversely when she has not slept well she does feel more fatigued.  She has graduated from vestibular and neuro rehab, and she has been moderately compliant with doing her home rehab now.  She does still have some dizziness with positional changes.  She does have some subjective feeling of dizziness sometimes when riding in the car, particular for long car rides.  Wed and Friday felt like her head felt a little bit dizzy.    Still with trouble with cars. Has to have a list at the grocery.  Dominica Severin started on a special diet - so now goes.   Anxiety some but very rarely in the past.    Has f/u with Dr. Brett Fairy coming up.  02/13/2020 work accomodations as before.   Review of Systems is noted in the HPI, as appropriate  Objective:   BP 129/80   Pulse 80   Temp 97.8 F (36.6 C) (Temporal)   Ht 5' 6.5" (1.689 m)   Wt 163 lb 12 oz (74.3 kg)   LMP 04/09/2012   BMI 26.03 kg/m   GEN: No acute distress; alert,appropriate. PULM: Breathing comfortably in no respiratory distress   Neuro: CN 2-12 grossly intact. PERRLA. EOMI. Sensation intact throughout. Str 5/5 all extremities. DTR 2+. No clonus. A and o x 4. Romberg neg. Finger nose neg. Heel -shin neg.   Horizontal and vertical saccades do induce vertigo  Finger-nose is normal, heel toe walking is normal.  Full range of motion at the neck in all directions.  BESS testing is 2/30  Orientation: 5/5 Immediate memory: 15/15 Concentration: Digits backwards: 3/4 Months in reverse order: 1/1 Concentration total: 5/5  PSYCH: Normally interactive. Conversant. Not depressed or anxious appearing.  Calm demeanor.     Laboratory and Imaging Data:  Assessment and Plan:     ICD-10-CM   1. Post concussive syndrome  F07.81    She continues to get better, but she does still have some predominantly vestibular symptoms.  She is going to have some good days and bad days,  but overall she is improving.  This includes record review, PT note review, and talking about work modifications and future goals.  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  I think that this will help with some of the induction of vestibular symptoms.  This should cause no issues and she has already discussed with management.  Continue with 10-minute breaks every 30 minutes was resting, which is making a big difference.  Follow-up: Return in about 2  months (around 02/13/2020).  No orders of the defined types were placed in this encounter.  There are no discontinued medications. No orders of the defined types were placed in this encounter.   Signed,  Maud Deed. Alonah Lineback, MD   Outpatient Encounter Medications as of 12/14/2019  Medication Sig  . donepezil (ARICEPT) 5 MG tablet Take 1 tablet (5 mg total) by mouth 2 (two) times daily.  . IBUPROFEN PO Take by mouth as needed.  Marland Kitchen ketoconazole (NIZORAL) 2 % shampoo USE 3 TIMES WEEKLY, LATHER , LEAVE ON 8-10 MINS, RINSE OFF  . LORazepam (ATIVAN) 0.5 MG tablet Take 1 tablet (0.5 mg total) by mouth daily as needed for anxiety.   No facility-administered encounter medications on file as of 12/14/2019.

## 2019-12-24 ENCOUNTER — Telehealth: Payer: Self-pay | Admitting: Family Medicine

## 2019-12-24 NOTE — Telephone Encounter (Signed)
Paperwork from [employer] is in Dr.Copland's In box to be completed for patient.

## 2019-12-28 ENCOUNTER — Ambulatory Visit: Payer: 59 | Attending: Internal Medicine

## 2019-12-28 DIAGNOSIS — Z23 Encounter for immunization: Secondary | ICD-10-CM

## 2019-12-28 NOTE — Progress Notes (Signed)
   Covid-19 Vaccination Clinic  Name:  [name]    MRN: [id number] DOB: [birth year]  12/28/2019  [name] was observed post Covid-19 immunization for 15 minutes without incident. She was provided with Vaccine Information Sheet and instruction to access the V-Safe system.   [name] was instructed to call 911 with any severe reactions post vaccine: Marland Kitchen Difficulty breathing  . Swelling of face and throat  . A fast heartbeat  . A bad rash all over body  . Dizziness and weakness   Immunizations Administered    Name Date Dose VIS Date Route   Pfizer COVID-19 Vaccine 12/28/2019  9:07 AM 0.3 mL 07/01/2018 Intramuscular   Manufacturer: [employer]   Lot: [id number]   [place]: [id number]

## 2020-01-13 ENCOUNTER — Telehealth: Payer: Self-pay | Admitting: Family Medicine

## 2020-01-13 NOTE — Telephone Encounter (Signed)
[employer] called in stating they are needing to have some clarification in regards to frequency and durations of breaks patient is supposed to have during work on form. Please advise.

## 2020-01-13 NOTE — Telephone Encounter (Signed)
I am not really sure what this message means.  Can you help me? Possibly they need an addendum?  My recommendations were pretty clear.

## 2020-01-14 NOTE — Telephone Encounter (Addendum)
Evi should be getting a 10 minute break for every 30 minutes that she works. Left message for [employer] to return my call.

## 2020-01-15 NOTE — Telephone Encounter (Signed)
Left message for [name] with [employer] that Katja should be getting a 10 minute break for every 30 minutes that she works.  I ask if this is not what he needs, he can return my call.

## 2020-01-18 ENCOUNTER — Encounter: Payer: Self-pay | Admitting: Family Medicine

## 2020-01-18 ENCOUNTER — Ambulatory Visit: Payer: 59 | Admitting: Family Medicine

## 2020-01-18 VITALS — BP 136/87 | HR 98 | Ht 65.0 in | Wt 165.0 lb

## 2020-01-18 DIAGNOSIS — H9313 Tinnitus, bilateral: Secondary | ICD-10-CM

## 2020-01-18 DIAGNOSIS — F0781 Postconcussional syndrome: Secondary | ICD-10-CM | POA: Diagnosis not present
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
## 2020-01-18 NOTE — Patient Instructions (Signed)
Continue healthy lifestyle habits. Exercise can help with brain fog and irritability. Consider talking with PCP about an antidepressant/antianxiety medication if you feel mood worsens.   Consider audiology workup. I have included information below about tinnitus (ringing in ears). Talk with PCP about trying ginkgo biloba.   Follow up with [place] as needed   Tinnitus Tinnitus refers to hearing a sound when there is no actual source for that sound. This is often described as ringing in the ears. However, people with this condition may hear a variety of noises, in one ear or in both ears. The sounds of tinnitus can be soft, loud, or somewhere in between. Tinnitus can last for a few seconds or can be constant for days. It may go away without treatment and come back at various times. When tinnitus is constant or happens often, it can lead to other problems, such as trouble sleeping and trouble concentrating. Almost everyone experiences tinnitus at some point. Tinnitus that is long-lasting (chronic) or comes back often (recurs) may require medical attention. What are the causes? The cause of tinnitus is often not known. In some cases, it can result from:  Exposure to loud noises from machinery, music, or other sources.  An object (foreign body) stuck in the ear.  Earwax buildup.  Drinking alcohol or caffeine.  Taking certain medicines.  Age-related hearing loss. It may also be caused by medical conditions such as:  Ear or sinus infections.  High blood pressure.  Heart diseases.  Anemia.  Allergies.  Meniere's disease.  Thyroid problems.  Tumors.  A weak, bulging blood vessel (aneurysm) near the ear. What are the signs or symptoms? The main symptom of tinnitus is hearing a sound when there is no source for that sound. It may sound like:  Buzzing.  Roaring.  Ringing.  Blowing air.  Hissing.  Whistling.  Sizzling.  Humming.  Running water.  A musical  note.  Tapping. Symptoms may affect only one ear (unilateral) or both ears (bilateral). How is this diagnosed? Tinnitus is diagnosed based on your symptoms, your medical history, and a physical exam. Your health care provider may do a thorough hearing test (audiologic exam) if your tinnitus:  Is unilateral.  Causes hearing difficulties.  Lasts 6 months or longer. You may work with a health care provider who specializes in hearing disorders (audiologist). You may be asked questions about your symptoms and how they affect your daily life. You may have other tests done, such as:  CT scan.  MRI.  An imaging test of how blood flows through your blood vessels (angiogram). How is this treated? Treating an underlying medical condition can sometimes make tinnitus go away. If your tinnitus continues, other treatments may include:  Medicines.  Therapy and counseling to help you manage the stress of living with tinnitus.  Sound generators to mask the tinnitus. These include: ? Tabletop sound machines that play relaxing sounds to help you fall asleep. ? Wearable devices that fit in your ear and play sounds or music. ? Acoustic neural stimulation. This involves using headphones to listen to music that contains an auditory signal. Over time, listening to this signal may change some pathways in your brain and make you less sensitive to tinnitus. This treatment is used for very severe cases when no other treatment is working.  Using hearing aids or cochlear implants if your tinnitus is related to hearing loss. Hearing aids are worn in the outer ear. Cochlear implants are surgically placed in the inner ear.  Follow these instructions at home: Managing symptoms      When possible, avoid being in loud places and being exposed to loud sounds.  Wear hearing protection, such as earplugs, when you are exposed to loud noises.  Use a white noise machine, a humidifier, or other devices to mask the  sound of tinnitus.  Practice techniques for reducing stress, such as meditation, yoga, or deep breathing. Work with your health care provider if you need help with managing stress.  Sleep with your head slightly raised. This may reduce the impact of tinnitus. General instructions  Do not use stimulants, such as nicotine, alcohol, or caffeine. Talk with your health care provider about other stimulants to avoid. Stimulants are substances that can make you feel alert and attentive by increasing certain activities in the body (such as heart rate and blood pressure). These substances may make tinnitus worse.  Take over-the-counter and prescription medicines only as told by your health care provider.  Try to get plenty of sleep each night.  Keep all follow-up visits as told by your health care provider. This is important. Contact a health care provider if:  Your tinnitus continues for 3 weeks or longer without stopping.  You develop sudden hearing loss.  Your symptoms get worse or do not get better with home care.  You feel you are not able to manage the stress of living with tinnitus. Get help right away if:  You develop tinnitus after a head injury.  You have tinnitus along with any of the following: ? Dizziness. ? Loss of balance. ? Nausea and vomiting. ? Sudden, severe headache. These symptoms may represent a serious problem that is an emergency. Do not wait to see if the symptoms will go away. Get medical help right away. Call your local emergency services (911 in the [place] Do not drive yourself to the hospital. Summary  Tinnitus refers to hearing a sound when there is no actual source for that sound. This is often described as ringing in the ears.  Symptoms may affect only one ear (unilateral) or both ears (bilateral).  Use a white noise machine, a humidifier, or other devices to mask the sound of tinnitus.  Do not use stimulants, such as nicotine, alcohol, or caffeine. Talk  with your health care provider about other stimulants to avoid. These substances may make tinnitus worse. This information is not intended to replace advice given to you by your health care provider. Make sure you discuss any questions you have with your health care provider. Document Revised: 11/05/2018 Document Reviewed: 01/31/2017 Elsevier Patient Education  [place].   Ginkgo, Ginkgo biloba tablets and capsules What is this medicine? GINKGO (GING koh) is an herbal product or dietary supplement. It is promoted to improve the symptoms of Alzheimer's disease, memory loss, impaired mental performance, dizziness, ringing in the ears, and circulatory disorders. Due to the fact that many mental or circulatory symptoms could be serious if not properly diagnosed by a health care provider, self-treatment is not recommended. The FDA has not approved this herb for any medical use. This herb may be used for other purposes; ask your health care provider or pharmacist if you have questions. This medicine may be used for other purposes; ask your health care provider or pharmacist if you have questions. What should I tell my health care provider before I take this medicine? They need to know if you have any of these conditions:  Alzheimer's disease or other dementia  bleeding problems  diabetes  high blood pressure  seizure disorder  taking blood-thinner medicines  an unusual or allergic reaction to ginkgo, other herbs, plants, medicines, foods, dyes, or preservatives  pregnant or trying to get pregnant  breast-feeding How should I use this medicine? Take by mouth with a glass of water. Follow the directions on the package labeling or ask your health care professional. For best results take this supplement with food. Do not take this supplement more often than directed. Contact your pediatrician regarding the use of this supplement in children. Special care may be needed. Overdosage:  If you think you have taken too much of this medicine contact a poison control center or emergency room at once. NOTE: This medicine is only for you. Do not share this medicine with others. What if I miss a dose? If you miss a dose, take it as soon as you can. If it is almost time for your next dose, take only that dose. Do not take double or extra doses. What may interact with this medicine? Check with your doctor or healthcare professional if you are taking any of the following medications:  aspirin and aspirin-like drugs  medicines for high blood pressure or heart problems like diltiazem, nifedipine, verapamil  medicines that treat or prevent blood clots like enoxaparin, heparin, warfarin  NSAIDs, medicines for pain and inflammation, like ibuprofen or naproxen  trazodone This list may not describe all possible interactions. Give your health care provider a list of all the medicines, herbs, non-prescription drugs, or dietary supplements you use. Also tell them if you smoke, drink alcohol, or use illegal drugs. Some items may interact with your medicine. What should I watch for while using this medicine? See your doctor if your symptoms do not get better or if they get worse. If you are scheduled for any medical or dental procedure, tell your healthcare provider that you are taking this supplement. You may need to stop taking this supplement before the procedure. Herbal or dietary supplements are not regulated like medicines. Rigid quality control standards are not required for dietary supplements. The purity and strength of these products can vary. The safety and effect of this dietary supplement for a certain disease or illness is not well known. This product is not intended to diagnose, treat, cure or prevent any disease. The Food and Drug Administration suggests the following to help consumers protect themselves:  Always read product labels and follow directions.  Natural does not mean  a product is safe for humans to take.  Look for products that include USP after the ingredient name. This means that the manufacturer followed the standards of the [place] Pharmacopoeia.  Supplements made or sold by a nationally known food or drug company are more likely to be made under tight controls. You can write to the company for more information about how the product was made. What side effects may I notice from receiving this medicine? Side effects that you should report to your doctor or health care professional as soon as possible:  allergic reactions like skin rash, itching or hives, swelling of the face, lips, or tongue  breathing problems  seizures  unusual bleeding, bruising Side effects that usually do not require medical attention (report to your doctor or health care professional if they continue or are bothersome):  headache  nausea, vomiting  stomach gas, upset This list may not describe all possible side effects. Call your doctor for medical advice about side effects. You may report side effects to  FDA at 1-800-FDA-1088. Where should I keep my medicine? Keep out of the reach of children. Store at room temperature or as directed on the package label. Protect from moisture. Throw away any unused supplement after the expiration date. NOTE: This sheet is a summary. It may not cover all possible information. If you have questions about this medicine, talk to your doctor, pharmacist, or health care provider.  2020 Elsevier/Gold Standard (2007-09-03 11:06:34)    Concussion, Adult  A concussion is a brain injury from a hard, direct hit (trauma) to your head or body. This direct hit causes the brain to quickly shake back and forth inside the skull. A concussion may also be called a mild traumatic brain injury (TBI). Healing from this injury can take time. What are the causes? This condition is caused by:  A direct hit to your head, such as: ? Running into a player during  a game. ? Being hit in a fight. ? Hitting your head on a hard surface.  A quick and sudden movement (jolt) of the head or neck, such as in a car crash. What are the signs or symptoms? The signs of a concussion can be hard to notice. They may be missed by you, family members, and doctors. You may look fine on the outside but may not act or feel normal. Physical symptoms  Headaches.  Being tired (fatigued).  Being dizzy.  Problems with body balance.  Problems seeing or hearing.  Being sensitive to light or noise.  Feeling sick to your stomach (nausea) or throwing up (vomiting).  Not sleeping or eating as you used to.  Loss of feeling (numbness) or tingling in the body.  Seizure. Mental and emotional symptoms  Problems remembering things.  Trouble focusing your mind (concentrating), organizing, or making decisions.  Being slow to think, act, react, speak, or read.  Feeling grouchy (irritable).  Having mood changes.  Feeling worried or nervous (anxious).  Feeling sad (depressed). How is this treated? This condition may be treated by:  Stopping sports or activity if you are injured. If you hit your head or have signs of concussion: ? Do not return to sports or activities the same day. ? Get checked by a doctor before you return to your activities.  Resting your body and your mind.  Being watched carefully, often at home.  Medicines to help with symptoms such as: ? Feeling sick to your stomach. ? Headaches. ? Problems with sleep.  Avoid taking strong pain medicines (opioids) for a concussion.  Avoiding alcohol and drugs.  Being asked to go to a concussion clinic or a place to help you recover (rehabilitation center). Recovery from a concussion can take time. Return to activities only:  When you are fully healed.  When your doctor says it is safe. Follow these instructions at home: Activity  Limit activities that need a lot of thought or focus, such  as: ? Homework or work for your job. ? Watching TV. ? Using the computer or phone. ? Playing memory games and puzzles.  Rest. Rest helps your brain heal. Make sure you: ? Get plenty of sleep. Most adults should get 7-9 hours of sleep each night. ? Rest during the day. Take naps or breaks when you feel tired.  Avoid activity like exercise until your doctor says its safe. Stop any activity that makes symptoms worse.  Do not do activities that could cause a second concussion, such as riding a bike or playing sports.  Ask your  doctor when you can return to your normal activities, such as school, work, sports, and driving. Your ability to react may be slower. Do not do these activities if you are dizzy. General instructions   Take over-the-counter and prescription medicines only as told by your doctor.  Do not drink alcohol until your doctor says you can.  Watch your symptoms and tell other people to do the same. Other problems can occur after a concussion. Older adults have a higher risk of serious problems.  Tell your work Freight forwarder, teachers, Government social research officer, school counselor, coach, or Product/process development scientist about your injury and symptoms. Tell them about what you can or cannot do.  Keep all follow-up visits as told by your doctor. This is important. How is this prevented?  It is very important that you do not get another brain injury. In rare cases, another injury can cause brain damage that will not go away, brain swelling, or death. The risk of this is greatest in the first 7-10 days after a head injury. To avoid injuries: ? Stop activities that could lead to a second concussion, such as contact sports, until your doctor says it is okay. ? When you return to sports or activities:  Do not crash into other players. This is how most concussions happen.  Follow the rules.  Respect other players. ? Get regular exercise. Do strength and balance training. ? Wear a helmet that fits you well  during sports, biking, or other activities.  Helmets can help protect you from serious skull and brain injuries, but they do not protect you from a concussion. Even when wearing a helmet, you should avoid being hit in the head. Contact a doctor if:  Your symptoms get worse or they do not get better.  You have new symptoms.  You have another injury. Get help right away if:  You have bad headaches or your headaches get worse.  You feel weak or numb in any part of your body.  You are mixed up (confused).  Your balance gets worse.  You keep throwing up.  You feel more sleepy than normal.  Your speech is not clear (is slurred).  You cannot recognize people or places.  You have a seizure.  Others have trouble waking you up.  You have behavior changes.  You have changes in how you see (vision).  You pass out (lose consciousness). Summary  A concussion is a brain injury from a hard, direct hit (trauma) to your head or body.  This condition is treated with rest and careful watching of symptoms.  If you keep having symptoms, call your doctor. This information is not intended to replace advice given to you by your health care provider. Make sure you discuss any questions you have with your health care provider. Document Revised: 12/12/2017 Document Reviewed: 12/12/2017 Elsevier Patient Education  [place].
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
## 2020-02-03 ENCOUNTER — Encounter: Payer: Self-pay | Admitting: Family Medicine

## 2020-02-03 ENCOUNTER — Other Ambulatory Visit: Payer: Self-pay

## 2020-02-03 ENCOUNTER — Ambulatory Visit (INDEPENDENT_AMBULATORY_CARE_PROVIDER_SITE_OTHER): Payer: 59 | Admitting: Family Medicine

## 2020-02-03 VITALS — BP 120/70 | HR 92 | Temp 97.3°F | Ht 65.0 in | Wt 164.0 lb

## 2020-02-03 DIAGNOSIS — H9319 Tinnitus, unspecified ear: Secondary | ICD-10-CM | POA: Diagnosis not present

## 2020-02-03 DIAGNOSIS — S060X0S Concussion without loss of consciousness, sequela: Secondary | ICD-10-CM | POA: Diagnosis not present

## 2020-02-03 DIAGNOSIS — F0781 Postconcussional syndrome: Secondary | ICD-10-CM | POA: Diagnosis not present

## 2020-02-25 DIAGNOSIS — H93292 Other abnormal auditory perceptions, left ear: Secondary | ICD-10-CM | POA: Diagnosis not present

## 2020-02-25 DIAGNOSIS — H9313 Tinnitus, bilateral: Secondary | ICD-10-CM | POA: Diagnosis not present

## 2020-04-11 ENCOUNTER — Ambulatory Visit: Payer: 59 | Admitting: Primary Care

## 2020-04-11 ENCOUNTER — Other Ambulatory Visit: Payer: Self-pay

## 2020-04-11 ENCOUNTER — Encounter: Payer: Self-pay | Admitting: Primary Care

## 2020-04-11 DIAGNOSIS — H9202 Otalgia, left ear: Secondary | ICD-10-CM | POA: Insufficient documentation
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
## 2020-04-11 NOTE — Assessment & Plan Note (Signed)
Acute for 10 days, intermittent, no improvement.   Start nasal spray. Start taking fexofenadine daily. Start taking Advil daily for now.  If no improvement within 3-4 days with this regimen, consider oral steroids.   She will update.

## 2020-04-11 NOTE — Patient Instructions (Signed)
Start your nasal spray as discussed. Instill 1 spray into each nostril twice daily.  Continue fexofenadine (Allegra), take this everyday for now.  Resume Advil, can take daily to help with inflammation and pain.  It was a pleasure to see you today!

## 2020-05-05 ENCOUNTER — Telehealth: Payer: Self-pay | Admitting: Family Medicine

## 2020-05-05 MED ORDER — GUAIFENESIN-CODEINE 100-10 MG/5ML PO SOLN: 5.0000 mL | Freq: Four times a day (QID) | ORAL | 0 refills | Status: AC | PRN

## 2020-05-05 NOTE — Telephone Encounter (Signed)
The pharmacy [facility] [address]. And coughing to the point her sore throat

## 2020-05-05 NOTE — Telephone Encounter (Signed)
[name] in wanted to know about sending in some cough medicine she has been coughing all night and the pearls doesn't work.

## 2020-05-05 NOTE — Telephone Encounter (Signed)
I have done this, but [name] absolutely needs to get checked for Covid-19

## 2020-05-05 NOTE — Telephone Encounter (Signed)
done

## 2020-05-05 NOTE — Telephone Encounter (Signed)
[name] notified as instructed by telephone.  [name] states she is waiting on [facility] to call her back about getting tested.

## 2020-05-07 ENCOUNTER — Encounter: Payer: Self-pay | Admitting: Family Medicine

## 2020-05-09 NOTE — Telephone Encounter (Signed)
Confirmed with patient.  Onset of sx 12/27.  Work party 12/23. [facility] informed.

## 2020-06-24 DIAGNOSIS — L821 Other seborrheic keratosis: Secondary | ICD-10-CM | POA: Diagnosis not present

## 2020-06-24 DIAGNOSIS — L578 Other skin changes due to chronic exposure to nonionizing radiation: Secondary | ICD-10-CM | POA: Diagnosis not present

## 2020-07-12 ENCOUNTER — Encounter: Payer: Self-pay | Admitting: Gastroenterology

## 2020-10-14 ENCOUNTER — Other Ambulatory Visit: Payer: Self-pay

## 2020-10-14 ENCOUNTER — Ambulatory Visit (AMBULATORY_SURGERY_CENTER): Payer: 59

## 2020-10-14 VITALS — Ht 65.0 in | Wt 170.0 lb

## 2020-10-14 DIAGNOSIS — Z1211 Encounter for screening for malignant neoplasm of colon: Secondary | ICD-10-CM

## 2020-10-14 MED ORDER — NA SULFATE-K SULFATE-MG SULF 17.5-3.13-1.6 GM/177ML PO SOLN
1.0000 | Freq: Once | ORAL | 0 refills | Status: AC
  Filled 2020-10-14: qty 354, 1d supply, fill #0

## 2020-10-14 NOTE — Progress Notes (Addendum)
TELEPHONE VISIT   No egg or soy allergy known to patient  No issues with past sedation with any surgeries or procedures Patient denies ever being told they had issues or difficulty with intubation  No FH of Malignant Hyperthermia No diet pills per patient No home 02 use per patient  No blood thinners per patient  Pt denies issues with constipation  No A fib or A flutter  EMMI video to pt or via [facility] 19 guidelines implemented in PV today with Pt and RN  Pt is fully vaccinated  for Covid   Due to the COVID-19 pandemic we are asking patients to follow certain guidelines.  Pt aware of COVID protocols and [facility] guidelines

## 2020-10-25 ENCOUNTER — Encounter: Payer: Self-pay | Admitting: Gastroenterology

## 2020-10-28 ENCOUNTER — Ambulatory Visit (AMBULATORY_SURGERY_CENTER): Payer: 59 | Admitting: Gastroenterology

## 2020-10-28 ENCOUNTER — Encounter: Payer: Self-pay | Admitting: Gastroenterology

## 2020-10-28 ENCOUNTER — Other Ambulatory Visit: Payer: Self-pay

## 2020-10-28 VITALS — BP 123/92 | HR 72 | Temp 97.8°F | Resp 15 | Ht 65.0 in | Wt 170.0 lb

## 2020-10-28 DIAGNOSIS — Z1211 Encounter for screening for malignant neoplasm of colon: Secondary | ICD-10-CM | POA: Diagnosis not present

## 2020-10-28 MED ORDER — SODIUM CHLORIDE 0.9 % IV SOLN
500.0000 mL | Freq: Once | INTRAVENOUS | Status: DC
Start: 2020-10-28 — End: 2020-10-28

## 2020-10-28 NOTE — Patient Instructions (Signed)
YOU HAD AN ENDOSCOPIC PROCEDURE TODAY AT THE [facility]:   Refer to the procedure report that was given to you for any specific questions about what was found during the examination.  If the procedure report does not answer your questions, please call your gastroenterologist to clarify.  If you requested that your care partner not be given the details of your procedure findings, then the procedure report has been included in a sealed envelope for you to review at your convenience later. ° °YOU SHOULD EXPECT: Some feelings of bloating in the abdomen. Passage of more gas than usual.  Walking can help get rid of the air that was put into your GI tract during the procedure and reduce the bloating. If you had a lower endoscopy (such as a colonoscopy or flexible sigmoidoscopy) you may notice spotting of blood in your stool or on the toilet paper. If you underwent a bowel prep for your procedure, you may not have a normal bowel movement for a few days. ° °Please Note:  You might notice some irritation and congestion in your nose or some drainage.  This is from the oxygen used during your procedure.  There is no need for concern and it should clear up in a day or so. ° °SYMPTOMS TO REPORT IMMEDIATELY: ° °Following lower endoscopy (colonoscopy or flexible sigmoidoscopy): ° Excessive amounts of blood in the stool ° Significant tenderness or worsening of abdominal pains ° Swelling of the abdomen that is new, acute ° Fever of 100°F or higher ° °For urgent or emergent issues, a gastroenterologist can be reached at any hour by calling [phone]. °Do not use MyChart messaging for urgent concerns.  ° ° °DIET:  We do recommend a small meal at first, but then you may proceed to your regular diet.  Drink plenty of fluids but you should avoid alcoholic beverages for 24 hours. ° °ACTIVITY:  You should plan to take it easy for the rest of today and you should NOT DRIVE or use heavy machinery until tomorrow (because of  the sedation medicines used during the test).   ° °FOLLOW UP: °Our staff will call the number listed on your records 48-72 hours following your procedure to check on you and address any questions or concerns that you may have regarding the information given to you following your procedure. If we do not reach you, we will leave a message.  We will attempt to reach you two times.  During this call, we will ask if you have developed any symptoms of COVID 19. If you develop any symptoms (ie: fever, flu-like symptoms, shortness of breath, cough etc.) before then, please call [phone].  If you test positive for Covid 19 in the 2 weeks post procedure, please call and report this information to us.   ° °SIGNATURES/CONFIDENTIALITY: °You and/or your care partner have signed paperwork which will be entered into your electronic medical record.  These signatures attest to the fact that that the information above on your After Visit Summary has been reviewed and is understood.  Full responsibility of the confidentiality of this discharge information lies with you and/or your care-partner.  °

## 2020-10-28 NOTE — Op Note (Signed)
[facility] Patient Name: [name] Procedure Date: 10/28/2020 8:20 AM MRN: [id number] Endoscopist: Thornton Park MD, MD Age: 60 Referring MD:  Date of Birth: 11/21/1960 Gender: Female Account #: [id number] Procedure:                Colonoscopy Indications:              Screening for colorectal malignant neoplasm                           Hamartomatous polyp on colonoscopy 2006                           Normal colonoscopy 2011                           No first degree family members with colon cancer or                            polyps Medicines:                Monitored Anesthesia Care Procedure:                Pre-Anesthesia Assessment:                           - Prior to the procedure, a History and Physical                            was performed, and patient medications and                            allergies were reviewed. The patient's tolerance of                            previous anesthesia was also reviewed. The risks                            and benefits of the procedure and the sedation                            options and risks were discussed with the patient.                            All questions were answered, and informed consent                            was obtained. Prior Anticoagulants: The patient has                            taken no previous anticoagulant or antiplatelet                            agents. ASA Grade Assessment: II - A patient with                            mild  systemic disease. After reviewing the risks                            and benefits, the patient was deemed in                            satisfactory condition to undergo the procedure.                           After obtaining informed consent, the colonoscope                            was passed under direct vision. Throughout the                            procedure, the patient's blood pressure, pulse, and                            oxygen saturations  were monitored continuously. The                            CF [id number] #[id number] was introduced through the anus                            and advanced to the 3 cm into the ileum. A second                            forward view of the right colon was performed. The                            colonoscopy was performed without difficulty. The                            patient tolerated the procedure well. The quality                            of the bowel preparation was good. The terminal                            ileum, ileocecal valve, appendiceal orifice, and                            rectum were photographed. Scope In: 8:40:37 AM Scope Out: 8:55:52 AM Scope Withdrawal Time: 0 hours 13 minutes 23 seconds  Total Procedure Duration: 0 hours 15 minutes 15 seconds  Findings:                 The perianal and digital rectal examinations were                            normal.                           The entire examined colon and distal terminal ilum  appeared normal on direct and retroflexion views. Complications:            No immediate complications. Estimated Blood Loss:     Estimated blood loss: none. Impression:               - The entire examined colon and distal terminal                            ileum are normal on direct and retroflexion views. Recommendation:           - Patient has a contact number available for                            emergencies. The signs and symptoms of potential                            delayed complications were discussed with the                            patient. Return to normal activities tomorrow.                            Written discharge instructions were provided to the                            patient.                           - Resume previous diet.                           - Continue present medications.                           - Repeat colonoscopy in 10 years for screening                             purposes, earlier with new symptoms.                           - Emerging evidence supports eating a diet of                            fruits, vegetables, grains, calcium, and yogurt                            while reducing red meat and alcohol may reduce the                            risk of colon cancer.                           - Thank you for allowing me to be involved in your                            colon cancer prevention.  Thornton Park MD, MD 10/28/2020 9:01:30 AM This report has been signed electronically.

## 2020-10-28 NOTE — Progress Notes (Signed)
pt tolerated well. VSS. awake and to recovery. Report given to RN.  

## 2020-10-28 NOTE — Progress Notes (Signed)
Medical history reviewed with no changes noted since PV. VS assessed by [facility]

## 2020-11-01 ENCOUNTER — Other Ambulatory Visit: Payer: Self-pay | Admitting: Family Medicine

## 2020-11-01 ENCOUNTER — Telehealth: Payer: Self-pay | Admitting: *Deleted

## 2020-11-01 DIAGNOSIS — Z1231 Encounter for screening mammogram for malignant neoplasm of breast: Secondary | ICD-10-CM

## 2020-11-01 NOTE — Telephone Encounter (Signed)
  Follow up Call-  Call back number 10/28/2020  Post procedure Call Back phone  # [phone]  Permission to leave phone message Yes  Some recent data might be hidden     Patient questions:  Do you have a fever, pain , or abdominal swelling? No. Pain Score  0 *  Have you tolerated food without any problems? Yes.    Have you been able to return to your normal activities? Yes.    Do you have any questions about your discharge instructions: Diet   No. Medications  No. Follow up visit  No.  Do you have questions or concerns about your Care? No.  Actions: * If pain score is 4 or above: No action needed, pain <4.  Have you developed a fever since your procedure? no  2.   Have you had an respiratory symptoms (SOB or cough) since your procedure? no  3.   Have you tested positive for COVID 19 since your procedure no  4.   Have you had any family members/close contacts diagnosed with the COVID 19 since your procedure?  no   If yes to any of these questions please route to Joylene John, RN and Joella Prince, RN

## 2020-11-30 ENCOUNTER — Other Ambulatory Visit: Payer: 59

## 2020-11-30 ENCOUNTER — Other Ambulatory Visit: Payer: Self-pay | Admitting: Family Medicine

## 2020-11-30 DIAGNOSIS — E7800 Pure hypercholesterolemia, unspecified: Secondary | ICD-10-CM

## 2020-12-01 ENCOUNTER — Other Ambulatory Visit (INDEPENDENT_AMBULATORY_CARE_PROVIDER_SITE_OTHER): Payer: 59

## 2020-12-01 ENCOUNTER — Other Ambulatory Visit: Payer: Self-pay

## 2020-12-01 DIAGNOSIS — E7800 Pure hypercholesterolemia, unspecified: Secondary | ICD-10-CM | POA: Diagnosis not present

## 2020-12-01 LAB — COMPREHENSIVE METABOLIC PANEL
ALT: 32 U/L (ref 0–35)
AST: 21 U/L (ref 0–37)
Albumin: 4.2 g/dL (ref 3.5–5.2)
Alkaline Phosphatase: 79 U/L (ref 39–117)
BUN: 13 mg/dL (ref 6–23)
CO2: 29 mEq/L (ref 19–32)
Calcium: 9.1 mg/dL (ref 8.4–10.5)
Chloride: 102 mEq/L (ref 96–112)
Creatinine, Ser: 0.93 mg/dL (ref 0.40–1.20)
GFR: 67.92 mL/min (ref >60.00)
Glucose, Bld: 106 mg/dL — ABNORMAL HIGH (ref 70–99)
Potassium: 3.9 mEq/L (ref 3.5–5.1)
Sodium: 137 mEq/L (ref 135–145)
Total Bilirubin: 0.5 mg/dL (ref 0.2–1.2)
Total Protein: 6.8 g/dL (ref 6.0–8.3)

## 2020-12-01 LAB — LIPID PANEL
Cholesterol: 212 mg/dL — ABNORMAL HIGH (ref 0–200)
HDL: 51 mg/dL (ref >39.00)
LDL Cholesterol: 125 mg/dL — ABNORMAL HIGH (ref 0–99)
NonHDL: 161.42
Total CHOL/HDL Ratio: 4
Triglycerides: 180 mg/dL — ABNORMAL HIGH (ref 0.0–149.0)
VLDL: 36 mg/dL (ref 0.0–40.0)

## 2020-12-02 ENCOUNTER — Encounter: Payer: Self-pay | Admitting: Family Medicine

## 2020-12-02 ENCOUNTER — Other Ambulatory Visit: Payer: Self-pay

## 2020-12-02 ENCOUNTER — Ambulatory Visit (INDEPENDENT_AMBULATORY_CARE_PROVIDER_SITE_OTHER): Payer: 59 | Admitting: Family Medicine

## 2020-12-02 VITALS — BP 110/60 | HR 92 | Temp 97.9°F | Ht 66.25 in | Wt 172.2 lb

## 2020-12-02 DIAGNOSIS — L2489 Irritant contact dermatitis due to other agents: Secondary | ICD-10-CM

## 2020-12-02 DIAGNOSIS — Z0000 Encounter for general adult medical examination without abnormal findings: Secondary | ICD-10-CM

## 2020-12-02 DIAGNOSIS — F3340 Major depressive disorder, recurrent, in remission, unspecified: Secondary | ICD-10-CM

## 2020-12-02 DIAGNOSIS — R7303 Prediabetes: Secondary | ICD-10-CM

## 2020-12-02 DIAGNOSIS — E7800 Pure hypercholesterolemia, unspecified: Secondary | ICD-10-CM

## 2020-12-02 MED ORDER — TRIAMCINOLONE ACETONIDE 0.1 % EX CREA
1.0000 "application " | TOPICAL_CREAM | Freq: Two times a day (BID) | CUTANEOUS | 0 refills | Status: DC
  Filled 2020-12-02: qty 30, 7d supply, fill #0

## 2020-12-02 NOTE — Progress Notes (Signed)
Patient ID: [name], female    DOB: 05-Mar-1961, 60 y.o.   MRN: [id number]  This visit was conducted in person.  BP 110/60   Pulse 92   Temp 97.9 F (36.6 C) (Temporal)   Ht 5' 6.25" (1.683 m)   Wt 172 lb 4 oz (78.1 kg)   LMP 04/09/2012   SpO2 96%   BMI 27.59 kg/m    CC: Chief Complaint  Patient presents with   Annual Exam    Subjective:   HPI: [name] is a 60 y.o. female presenting on 12/02/2020 for Annual Exam  MDD: Well controlled on no medication.  Elevated Cholesterol: Lab Results  Component Value Date   CHOL 212 (H) 12/01/2020   HDL 51.00 12/01/2020   LDLCALC 125 (H) 12/01/2020   LDLDIRECT 125.7 07/08/2008   TRIG 180.0 (H) 12/01/2020   CHOLHDL 4 12/01/2020   The 10-year ASCVD risk score Mikey Bussing DC Jr., et al., 2013) is: 2.2%   Values used to calculate the score:     Age: 40 years     Sex: Female     Is Non-Hispanic African American: No     Diabetic: No     Tobacco smoker: No     Systolic Blood Pressure: 409 mmHg     Is BP treated: No     HDL Cholesterol: 51 mg/dL     Total Cholesterol: 212 mg/dL Using medications without problems: Muscle aches:  Diet compliance: poor.. planning on starting low fat Exercise: none Other complaints:  Prediabetes : glucose 106     Relevant past medical, surgical, family and social history reviewed and updated as indicated. Interim medical history since our last visit reviewed. Allergies and medications reviewed and updated. Outpatient Medications Prior to Visit  Medication Sig Dispense Refill   IBUPROFEN PO Take by mouth as needed.     LORazepam (ATIVAN) 0.5 MG tablet Take 1 tablet (0.5 mg total) by mouth daily as needed for anxiety. 30 tablet 0   ketoconazole (NIZORAL) 2 % shampoo USE 3 TIMES WEEKLY, LATHER , LEAVE ON 8-10 MINS, RINSE OFF (Patient not taking: Reported on 10/28/2020) 120 mL 5   No facility-administered medications prior to visit.     Per HPI unless specifically indicated in ROS section  below Review of Systems  Constitutional:  Negative for fatigue and fever.  HENT:  Negative for congestion.   Eyes:  Negative for pain.  Respiratory:  Negative for cough and shortness of breath.   Cardiovascular:  Negative for chest pain, palpitations and leg swelling.  Gastrointestinal:  Negative for abdominal pain.  Genitourinary:  Negative for dysuria and vaginal bleeding.  Musculoskeletal:  Negative for back pain.  Neurological:  Negative for syncope, light-headedness and headaches.  Psychiatric/Behavioral:  Negative for dysphoric mood.   Objective:  BP 110/60   Pulse 92   Temp 97.9 F (36.6 C) (Temporal)   Ht 5' 6.25" (1.683 m)   Wt 172 lb 4 oz (78.1 kg)   LMP 04/09/2012   SpO2 96%   BMI 27.59 kg/m   Wt Readings from Last 3 Encounters:  12/02/20 172 lb 4 oz (78.1 kg)  10/28/20 170 lb (77.1 kg)  10/14/20 170 lb (77.1 kg)      Physical Exam Constitutional:      General: She is not in acute distress.    Appearance: Normal appearance. She is well-developed. She is not ill-appearing or toxic-appearing.  HENT:     Head: Normocephalic.  Right Ear: Hearing, tympanic membrane, ear canal and external ear normal. Tympanic membrane is not erythematous, retracted or bulging.     Left Ear: Hearing, tympanic membrane, ear canal and external ear normal. Tympanic membrane is not erythematous, retracted or bulging.     Nose: No mucosal edema or rhinorrhea.     Right Sinus: No maxillary sinus tenderness or frontal sinus tenderness.     Left Sinus: No maxillary sinus tenderness or frontal sinus tenderness.     Mouth/Throat:     Pharynx: Uvula midline.  Eyes:     General: Lids are normal. Lids are everted, no foreign bodies appreciated.     Conjunctiva/sclera: Conjunctivae normal.     Pupils: Pupils are equal, round, and reactive to light.  Neck:     Thyroid: No thyroid mass or thyromegaly.     Vascular: No carotid bruit.     Trachea: Trachea normal.  Cardiovascular:     Rate and  Rhythm: Normal rate and regular rhythm.     Pulses: Normal pulses.     Heart sounds: Normal heart sounds, S1 normal and S2 normal. No murmur heard.   No friction rub. No gallop.  Pulmonary:     Effort: Pulmonary effort is normal. No tachypnea or respiratory distress.     Breath sounds: Normal breath sounds. No decreased breath sounds, wheezing, rhonchi or rales.  Abdominal:     General: Bowel sounds are normal.     Palpations: Abdomen is soft.     Tenderness: There is no abdominal tenderness.  Musculoskeletal:     Cervical back: Normal range of motion and neck supple.  Skin:    General: Skin is warm and dry.     Findings: No rash.     Comments: Dry flaky skin on bilateral elbows where she leans  Neurological:     Mental Status: She is alert.  Psychiatric:        Mood and Affect: Mood is not anxious or depressed.        Speech: Speech normal.        Behavior: Behavior normal. Behavior is cooperative.        Thought Content: Thought content normal.        Judgment: Judgment normal.      Results for orders placed or performed in visit on 12/01/20  Comprehensive metabolic panel  Result Value Ref Range   Sodium 137 135 - 145 mEq/L   Potassium 3.9 3.5 - 5.1 mEq/L   Chloride 102 96 - 112 mEq/L   CO2 29 19 - 32 mEq/L   Glucose, Bld 106 (H) 70 - 99 mg/dL   BUN 13 6 - 23 mg/dL   Creatinine, Ser 0.93 0.40 - 1.20 mg/dL   Total Bilirubin 0.5 0.2 - 1.2 mg/dL   Alkaline Phosphatase 79 39 - 117 U/L   AST 21 0 - 37 U/L   ALT 32 0 - 35 U/L   Total Protein 6.8 6.0 - 8.3 g/dL   Albumin 4.2 3.5 - 5.2 g/dL   GFR 67.92 >60.00 mL/min   Calcium 9.1 8.4 - 10.5 mg/dL  Lipid panel  Result Value Ref Range   Cholesterol 212 (H) 0 - 200 mg/dL   Triglycerides 180.0 (H) 0.0 - 149.0 mg/dL   HDL 51.00 >39.00 mg/dL   VLDL 36.0 0.0 - 40.0 mg/dL   LDL Cholesterol 125 (H) 0 - 99 mg/dL   Total CHOL/HDL Ratio 4    NonHDL 161.42     This visit  occurred during the SARS-CoV-2 public health emergency.   Safety protocols were in place, including screening questions prior to the visit, additional usage of staff PPE, and extensive cleaning of exam room while observing appropriate contact time as indicated for disinfecting solutions.   COVID 19 screen:  No recent travel or known exposure to COVID19 The patient denies respiratory symptoms of COVID 19 at this time. The importance of social distancing was discussed today.   Assessment and Plan The patient's preventative maintenance and recommended screening tests for an annual wellness exam were reviewed in full today. Brought up to date unless services declined.  Counselled on the importance of diet, exercise, and its role in overall health and mortality. The patient's FH and SH was reviewed, including their home life, tobacco status, and drug and alcohol status.    Vaccines:  Tdap  uptodate.  Consider shingrix. Discussed COVID19 vaccine  booster side effects and benefits. Strongly encouraged the patient to get the vaccine. Questions answered.  PAP/DVE: 02/2016 nml and no HPV, repeat in 5 years.  MAMMO: 12/2016 low risk, scheduled  Colon: 10/28/2020 repeat in 10 years Hep C neg. Nonsmoker  STD screen: refused  Problem List Items Addressed This Visit     Irritant contact dermatitis due to other agents    Treat with topical steroid cream x 2 week can follow with PTC hydrocortisone.       MDD (recurrent major depressive disorder) in remission ([place])    Well controlled on no medication.       Other Visit Diagnoses     Routine general medical examination at a health care facility    -  Primary   Hypercholesterolemia       Prediabetes           Eliezer Lofts, MD

## 2020-12-02 NOTE — Assessment & Plan Note (Signed)
Treat with topical steroid cream x 2 week can follow with PTC hydrocortisone.

## 2020-12-02 NOTE — Assessment & Plan Note (Signed)
Well controlled on no medication  

## 2020-12-02 NOTE — Progress Notes (Signed)
No critical labs need to be addressed urgently. We will discuss labs in detail at upcoming office visit.   

## 2020-12-02 NOTE — Patient Instructions (Addendum)
Get back on track with regular exercise, stop/decrease soda and work on low fat high protein low carb diet. Call if interested in repeating labs in 3-6 months.

## 2021-01-05 ENCOUNTER — Encounter: Payer: Self-pay | Admitting: Family Medicine

## 2021-01-05 ENCOUNTER — Other Ambulatory Visit: Payer: Self-pay

## 2021-01-05 ENCOUNTER — Ambulatory Visit
Admission: RE | Admit: 2021-01-05 | Discharge: 2021-01-05 | Disposition: A | Discharge status: Home / Self Care | Payer: 59 | Attending: Family Medicine | Admitting: Family Medicine

## 2021-01-05 ENCOUNTER — Ambulatory Visit
Admission: RE | Admit: 2021-01-05 | Discharge: 2021-01-05 | Disposition: A | Discharge status: Home / Self Care | Payer: 59 | Source: Ambulatory Visit | Attending: Family Medicine | Admitting: Family Medicine

## 2021-01-05 ENCOUNTER — Ambulatory Visit (INDEPENDENT_AMBULATORY_CARE_PROVIDER_SITE_OTHER): Payer: 59 | Admitting: Family Medicine

## 2021-01-05 VITALS — BP 120/70 | HR 93 | Temp 97.9°F | Ht 66.25 in | Wt 175.0 lb

## 2021-01-05 DIAGNOSIS — R053 Chronic cough: Secondary | ICD-10-CM | POA: Diagnosis not present

## 2021-01-05 DIAGNOSIS — R059 Cough, unspecified: Secondary | ICD-10-CM | POA: Diagnosis not present

## 2021-01-05 NOTE — Assessment & Plan Note (Signed)
>   1 year .. worsening   Likely asthma/COPD vs allergies/PND vs GERD.  Given family history of lung cancer at young age.. eval with X-ray to rule out cancer,infetion.   If negative and not improving with PPI and nasal steroids.. consider referral to pulmonary for PFTs

## 2021-01-05 NOTE — Progress Notes (Signed)
Patient ID: [name], female    DOB: 08-14-1960, 60 y.o.   MRN: [id number]  This visit was conducted in person.  BP 120/70   Pulse 93   Temp 97.9 F (36.6 C) (Temporal)   Ht 5' 6.25" (1.683 m)   Wt 175 lb (79.4 kg)   LMP 04/09/2012   SpO2 98%   BMI 28.03 kg/m    CC: Chief Complaint  Patient presents with   Cough    Chronic    Subjective:   HPI: [name] is a 60 y.o. female presenting on 01/05/2021 for Cough (Chronic)   She has had history of intermittent chronic cough associated with getting hot x1-2 years.  Dry hacky cough, coughing fits.. per husband getting worse  at night.    OCc congestion in nose... no other SOB. No wheeze. She is using [facility] Zyrtec, occ Rhinocort.  She has been exercising more lately.   No heartburn.   Possible childhood asthma Hx of  tobacco use, 6 years ( 1 ppd) none in 30 years.  Family history of lung cancer.. PMF, PGM,  uncle  Brother with " spot" on lungs.. followed... no definite cancer.    Relevant past medical, surgical, family and social history reviewed and updated as indicated. Interim medical history since our last visit reviewed. Allergies and medications reviewed and updated. Outpatient Medications Prior to Visit  Medication Sig Dispense Refill   IBUPROFEN PO Take by mouth as needed.     LORazepam (ATIVAN) 0.5 MG tablet Take 1 tablet (0.5 mg total) by mouth daily as needed for anxiety. 30 tablet 0   triamcinolone cream (KENALOG) 0.1 % Apply 1 application topically 2 (two) times daily. 30 g 0   No facility-administered medications prior to visit.     Per HPI unless specifically indicated in ROS section below Review of Systems  Constitutional:  Negative for fatigue and fever.  HENT:  Negative for ear pain.   Eyes:  Negative for pain.  Respiratory:  Negative for chest tightness and shortness of breath.   Cardiovascular:  Negative for chest pain, palpitations and leg swelling.  Gastrointestinal:  Negative for  abdominal pain.  Genitourinary:  Negative for dysuria.  Objective:  BP 120/70   Pulse 93   Temp 97.9 F (36.6 C) (Temporal)   Ht 5' 6.25" (1.683 m)   Wt 175 lb (79.4 kg)   LMP 04/09/2012   SpO2 98%   BMI 28.03 kg/m   Wt Readings from Last 3 Encounters:  01/05/21 175 lb (79.4 kg)  12/02/20 172 lb 4 oz (78.1 kg)  10/28/20 170 lb (77.1 kg)      Physical Exam Constitutional:      General: She is not in acute distress.    Appearance: Normal appearance. She is well-developed. She is not ill-appearing or toxic-appearing.  HENT:     Head: Normocephalic.     Right Ear: Hearing, tympanic membrane, ear canal and external ear normal. Tympanic membrane is not erythematous, retracted or bulging.     Left Ear: Hearing, tympanic membrane, ear canal and external ear normal. Tympanic membrane is not erythematous, retracted or bulging.     Nose: No mucosal edema or rhinorrhea.     Right Sinus: No maxillary sinus tenderness or frontal sinus tenderness.     Left Sinus: No maxillary sinus tenderness or frontal sinus tenderness.     Mouth/Throat:     Pharynx: Uvula midline.  Eyes:     General: Lids are  normal. Lids are everted, no foreign bodies appreciated.     Conjunctiva/sclera: Conjunctivae normal.     Pupils: Pupils are equal, round, and reactive to light.  Neck:     Thyroid: No thyroid mass or thyromegaly.     Vascular: No carotid bruit.     Trachea: Trachea normal.  Cardiovascular:     Rate and Rhythm: Normal rate and regular rhythm.     Pulses: Normal pulses.     Heart sounds: Normal heart sounds, S1 normal and S2 normal. No murmur heard.   No friction rub. No gallop.  Pulmonary:     Effort: Pulmonary effort is normal. No tachypnea or respiratory distress.     Breath sounds: Normal breath sounds. No decreased breath sounds, wheezing, rhonchi or rales.  Abdominal:     General: Bowel sounds are normal.     Palpations: Abdomen is soft.     Tenderness: There is no abdominal  tenderness.  Musculoskeletal:     Cervical back: Normal range of motion and neck supple.  Skin:    General: Skin is warm and dry.     Findings: No rash.  Neurological:     Mental Status: She is alert.  Psychiatric:        Mood and Affect: Mood is not anxious or depressed.        Speech: Speech normal.        Behavior: Behavior normal. Behavior is cooperative.        Thought Content: Thought content normal.        Judgment: Judgment normal.      Results for orders placed or performed in visit on 12/01/20  Comprehensive metabolic panel  Result Value Ref Range   Sodium 137 135 - 145 mEq/L   Potassium 3.9 3.5 - 5.1 mEq/L   Chloride 102 96 - 112 mEq/L   CO2 29 19 - 32 mEq/L   Glucose, Bld 106 (H) 70 - 99 mg/dL   BUN 13 6 - 23 mg/dL   Creatinine, Ser 0.93 0.40 - 1.20 mg/dL   Total Bilirubin 0.5 0.2 - 1.2 mg/dL   Alkaline Phosphatase 79 39 - 117 U/L   AST 21 0 - 37 U/L   ALT 32 0 - 35 U/L   Total Protein 6.8 6.0 - 8.3 g/dL   Albumin 4.2 3.5 - 5.2 g/dL   GFR 67.92 >60.00 mL/min   Calcium 9.1 8.4 - 10.5 mg/dL  Lipid panel  Result Value Ref Range   Cholesterol 212 (H) 0 - 200 mg/dL   Triglycerides 180.0 (H) 0.0 - 149.0 mg/dL   HDL 51.00 >39.00 mg/dL   VLDL 36.0 0.0 - 40.0 mg/dL   LDL Cholesterol 125 (H) 0 - 99 mg/dL   Total CHOL/HDL Ratio 4    NonHDL 161.42     This visit occurred during the SARS-CoV-2 public health emergency.  Safety protocols were in place, including screening questions prior to the visit, additional usage of staff PPE, and extensive cleaning of exam room while observing appropriate contact time as indicated for disinfecting solutions.   COVID 19 screen:  No recent travel or known exposure to COVID19 The patient denies respiratory symptoms of COVID 19 at this time. The importance of social distancing was discussed today.   Assessment and Plan Problem List Items Addressed This Visit     Cough, persistent - Primary    > 1 year .. worsening   Likely  asthma/COPD vs allergies/PND vs GERD.  Given family history of  lung cancer at young age.. eval with X-ray to rule out cancer,infetion.   If negative and not improving with PPI and nasal steroids.. consider referral to pulmonary for PFTs        Relevant Orders   DG Chest 2 View       Eliezer Lofts, MD

## 2021-01-05 NOTE — Patient Instructions (Addendum)
Have X-ray done at [facility].  Start Flonase or Rhinocort 2 sprays per nostril daily.  Start omeprazole 20 mg daily  If cough not improving in 2 week call for further recommendations.

## 2021-01-11 ENCOUNTER — Other Ambulatory Visit: Payer: Self-pay

## 2021-01-11 MED ORDER — BUDESONIDE 32 MCG/ACT NA SUSP
2.0000 | Freq: Every day | NASAL | 3 refills | Status: AC
  Filled 2021-01-11: qty 15, fill #0

## 2021-01-11 MED ORDER — OMEPRAZOLE 20 MG PO CPDR
20.0000 mg | DELAYED_RELEASE_CAPSULE | Freq: Every day | ORAL | 3 refills | Status: DC
  Filled 2021-01-11: qty 90, 90d supply, fill #0

## 2021-01-11 MED ORDER — CETIRIZINE HCL 10 MG PO TABS
10.0000 mg | ORAL_TABLET | Freq: Every day | ORAL | 3 refills | Status: DC
  Filled 2021-01-11: qty 90, 90d supply, fill #0

## 2021-01-11 NOTE — Telephone Encounter (Signed)
Not on current medication list.  Ok to send in as requested?
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
## 2021-01-13 ENCOUNTER — Ambulatory Visit: Payer: 59 | Admitting: Family Medicine

## 2021-01-13 ENCOUNTER — Ambulatory Visit: Payer: Self-pay

## 2021-01-13 ENCOUNTER — Ambulatory Visit (INDEPENDENT_AMBULATORY_CARE_PROVIDER_SITE_OTHER): Payer: 59

## 2021-01-13 ENCOUNTER — Other Ambulatory Visit: Payer: Self-pay

## 2021-01-13 VITALS — BP 110/72 | HR 95 | Ht 66.0 in | Wt 174.0 lb

## 2021-01-13 DIAGNOSIS — R102 Pelvic and perineal pain: Secondary | ICD-10-CM | POA: Diagnosis not present

## 2021-01-13 DIAGNOSIS — M25552 Pain in left hip: Secondary | ICD-10-CM

## 2021-01-13 DIAGNOSIS — M25551 Pain in right hip: Secondary | ICD-10-CM

## 2021-01-13 NOTE — Patient Instructions (Addendum)
Xray today Do prescribed exercises at least 3x a week Vit D 2000iu and Tart Cherry '200mg'$  See you again in 5 to 6 weeks

## 2021-01-14 ENCOUNTER — Encounter: Payer: Self-pay | Admitting: Family Medicine

## 2021-01-14 NOTE — Assessment & Plan Note (Signed)
Patient has had bilateral hip pain that seems to be right greater than left.  Does have some limited range of motion.  Does have some weakness noted of the hip abductors as well as some tightness noted in the gluteal area on the right side.  Do feel further imaging with x-rays of the lumbar as well as the pelvis are beneficial at this time.  Discussed different medications and patient would like to avoid if possible and will use over-the-counter ibuprofen.Discussed icing regimen and home exercises.  Follow-up with me again 6 to 8 weeks.  Continuing to have difficulty consider formal physical therapy.  We can also consider the possibility of injection.

## 2021-01-16 ENCOUNTER — Encounter: Payer: Self-pay | Admitting: Family Medicine

## 2021-01-20 ENCOUNTER — Other Ambulatory Visit: Payer: Self-pay

## 2021-01-20 ENCOUNTER — Ambulatory Visit
Admission: RE | Admit: 2021-01-20 | Discharge: 2021-01-20 | Disposition: A | Discharge status: Home / Self Care | Payer: 59 | Source: Ambulatory Visit | Attending: Family Medicine | Admitting: Family Medicine

## 2021-01-20 DIAGNOSIS — Z1231 Encounter for screening mammogram for malignant neoplasm of breast: Secondary | ICD-10-CM

## 2021-02-22 ENCOUNTER — Telehealth: Payer: Self-pay | Admitting: Family Medicine

## 2021-02-22 NOTE — Telephone Encounter (Signed)
Pt called she needs the letter from last year when she saw Dr. Lorelei Pont on April 9th 2021 She needs the dates when she was taken out of work

## 2021-02-24 ENCOUNTER — Ambulatory Visit: Payer: 59 | Admitting: Family Medicine

## 2021-02-24 NOTE — Telephone Encounter (Signed)
Letter written as requested and sent to patient's MyChart.

## 2021-02-27 ENCOUNTER — Encounter: Payer: Self-pay | Admitting: Family Medicine
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
## 2021-04-07 ENCOUNTER — Ambulatory Visit: Payer: 59 | Admitting: Family Medicine

## 2021-04-07 ENCOUNTER — Other Ambulatory Visit: Payer: Self-pay

## 2021-04-07 VITALS — BP 122/78 | HR 89 | Ht 66.0 in | Wt 171.0 lb

## 2021-04-07 DIAGNOSIS — M25551 Pain in right hip: Secondary | ICD-10-CM

## 2021-04-07 NOTE — Patient Instructions (Signed)
Lets give PT a good try Lets do everything else we discussed See you again in 4-6 weeks after physical therapy

## 2021-04-07 NOTE — Assessment & Plan Note (Signed)
Discussed the potential for more of the tendinitis.  Showed patient on a model of what I do think that is contributing to more the discomfort and pain.  Discussed which activities to do which wants to avoid.  Increase activity slowly.  Patient will start with formal physical therapy which I think will be helpful for other modalities.  Depending on how patient responds then we will discussed with patient about different treatment options such as injections or advanced imaging if warranted.

## 2021-04-27 ENCOUNTER — Encounter: Payer: Self-pay | Admitting: Family Medicine

## 2021-05-16 ENCOUNTER — Other Ambulatory Visit: Payer: Self-pay

## 2021-05-16 ENCOUNTER — Ambulatory Visit: Payer: 59 | Attending: Family Medicine

## 2021-05-16 DIAGNOSIS — M25651 Stiffness of right hip, not elsewhere classified: Secondary | ICD-10-CM | POA: Diagnosis not present

## 2021-05-16 DIAGNOSIS — R262 Difficulty in walking, not elsewhere classified: Secondary | ICD-10-CM

## 2021-05-16 DIAGNOSIS — M25551 Pain in right hip: Secondary | ICD-10-CM | POA: Diagnosis not present

## 2021-05-16 DIAGNOSIS — M6281 Muscle weakness (generalized): Secondary | ICD-10-CM

## 2021-05-16 DIAGNOSIS — H9311 Tinnitus, right ear: Secondary | ICD-10-CM | POA: Diagnosis not present

## 2021-05-16 DIAGNOSIS — H9313 Tinnitus, bilateral: Secondary | ICD-10-CM | POA: Diagnosis not present

## 2021-05-16 DIAGNOSIS — M25552 Pain in left hip: Secondary | ICD-10-CM | POA: Diagnosis not present

## 2021-05-16 DIAGNOSIS — M25652 Stiffness of left hip, not elsewhere classified: Secondary | ICD-10-CM | POA: Diagnosis not present
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
## 2021-05-24 ENCOUNTER — Ambulatory Visit: Payer: 59
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
## 2021-05-31 ENCOUNTER — Ambulatory Visit: Payer: 59

## 2021-05-31 ENCOUNTER — Other Ambulatory Visit: Payer: Self-pay

## 2021-05-31 DIAGNOSIS — R262 Difficulty in walking, not elsewhere classified: Secondary | ICD-10-CM | POA: Diagnosis not present

## 2021-05-31 DIAGNOSIS — M25552 Pain in left hip: Secondary | ICD-10-CM

## 2021-05-31 DIAGNOSIS — M6281 Muscle weakness (generalized): Secondary | ICD-10-CM | POA: Diagnosis not present

## 2021-05-31 DIAGNOSIS — M25652 Stiffness of left hip, not elsewhere classified: Secondary | ICD-10-CM | POA: Diagnosis not present

## 2021-05-31 DIAGNOSIS — M25651 Stiffness of right hip, not elsewhere classified: Secondary | ICD-10-CM | POA: Diagnosis not present

## 2021-05-31 DIAGNOSIS — M25551 Pain in right hip: Secondary | ICD-10-CM | POA: Diagnosis not present

## 2021-06-07 ENCOUNTER — Telehealth (INDEPENDENT_AMBULATORY_CARE_PROVIDER_SITE_OTHER): Payer: 59 | Admitting: Family Medicine

## 2021-06-07 ENCOUNTER — Ambulatory Visit: Payer: 59

## 2021-06-07 ENCOUNTER — Other Ambulatory Visit: Payer: Self-pay

## 2021-06-07 ENCOUNTER — Encounter: Payer: 59 | Admitting: Family Medicine

## 2021-06-07 ENCOUNTER — Encounter: Payer: Self-pay | Admitting: Family Medicine

## 2021-06-07 DIAGNOSIS — Z20822 Contact with and (suspected) exposure to covid-19: Secondary | ICD-10-CM

## 2021-06-07 DIAGNOSIS — R6889 Other general symptoms and signs: Secondary | ICD-10-CM | POA: Diagnosis not present

## 2021-06-07 MED ORDER — GUAIFENESIN-CODEINE 100-10 MG/5ML PO SYRP
5.0000 mL | ORAL_SOLUTION | Freq: Three times a day (TID) | ORAL | 0 refills | Status: DC | PRN
  Filled 2021-06-07: qty 120, 8d supply, fill #0

## 2021-06-07 MED ORDER — OSELTAMIVIR PHOSPHATE 75 MG PO CAPS
75.0000 mg | ORAL_CAPSULE | Freq: Two times a day (BID) | ORAL | 0 refills | Status: DC
  Filled 2021-06-07: qty 10, 5d supply, fill #0

## 2021-06-07 MED ORDER — CARESTART COVID-19 HOME TEST VI KIT
PACK | 0 refills | Status: DC
  Filled 2021-06-07: qty 2, 4d supply, fill #0

## 2021-06-07 NOTE — Progress Notes (Signed)
Takao Lizer T. Malavika Lira, MD Primary [facility] and [place] [facility] [place], [address] Phone: [phone]   FAX: [phone]  Terri LIEVANOS - 61 y.o. female   MRN [id number]   Date of Birth: Mar 21, 1961  Visit Date: 06/07/2021   PCP: Jinny Sanders, MD   Referred by: Jinny Sanders, MD  Virtual Visit via Video Note:  I connected with  Darl Householder on 06/07/2021  9:20 AM EST by a video enabled telemedicine application and verified that I am speaking with the correct person using two identifiers.   Location patient: home computer, tablet, or smartphone Location provider: work or home office Consent: Verbal consent directly obtained from Darl Householder. Persons participating in the virtual visit: patient, provider  I discussed the limitations of evaluation and management by telemedicine and the availability of in person appointments. The patient expressed understanding and agreed to proceed.  Acute illness.  Ear hurting and coughing  History of Present Illness:  Couging all of the time, has a fever of 101 this morning Ear hurting and throat Covid negative.  No smoking in 30 years.  No n/v/d A little bit stopped up, but ears are just ringing.   Throat is hurting.  Motrin a couple of hours ago.   Body aches are pretty bad.   Sx started on Monday - went to bed then. Woke up feeling bad yesterday.    Review of Systems as above: See pertinent positives and pertinent negatives per HPI No acute distress verbally   Observations/Objective/Exam:  An attempt was made to discern vital signs over the phone and per patient if applicable and possible.   General:    Alert, Oriented, appears well and in no acute distress  Pulmonary:     On inspection no signs of respiratory distress.  Psych / Neurological:     Pleasant and cooperative.  Assessment and Plan:    ICD-10-CM   1. Suspected COVID-19 virus infection   Z20.822     2. Flu-like symptoms  R68.89      With a sore throat, fever, coughing and congestion, my primary worry would be acute COVID-19.  She has done 2 test which were negative.  I encouraged her to take a test daily, and if this comes back positive, then she will let me know and I will send her in some antivirals.  Certainly right now her symptoms could be influenza as well, I am going to give her some Tamiflu to take, continue supportive care, and Robitussin-AC as needed.  Essentially impossible to differentiate between the 2.  I discussed the assessment and treatment plan with the patient. The patient was provided an opportunity to ask questions and all were answered. The patient agreed with the plan and demonstrated an understanding of the instructions.   The patient was advised to call back or seek an in-person evaluation if the symptoms worsen or if the condition fails to improve as anticipated.  Follow-up: prn unless noted otherwise below No follow-ups on file.  Meds ordered this encounter  Medications   oseltamivir (TAMIFLU) 75 MG capsule    Sig: Take 1 capsule (75 mg total) by mouth 2 (two) times daily.    Dispense:  10 capsule    Refill:  0   guaiFENesin-codeine (ROBITUSSIN AC) 100-10 MG/5ML syrup    Sig: Take 5 mLs by mouth 3 (three) times daily as needed for cough.  Dispense:  120 mL    Refill:  0   No orders of the defined types were placed in this encounter.   Signed,  Maud Deed. Breeze Berringer, MD

## 2021-06-07 NOTE — Progress Notes (Signed)
This encounter was created in error - please disregard.

## 2021-06-09 ENCOUNTER — Encounter: Payer: Self-pay | Admitting: Family Medicine

## 2021-06-14 ENCOUNTER — Ambulatory Visit: Payer: 59

## 2021-09-30 IMAGING — CR DG CHEST 2V
1 series · 2 of 2 positions shown · non-contrast
Comparison: 04/23/2012

CLINICAL DATA: Chronic dry cough for 1 year, getting worse, former
smoker

EXAM:
CHEST - 2 VIEW

[Series 1: dg chest 2 view · 0.14mm/px · 2 of 2 slices shown]
[im 1/2]
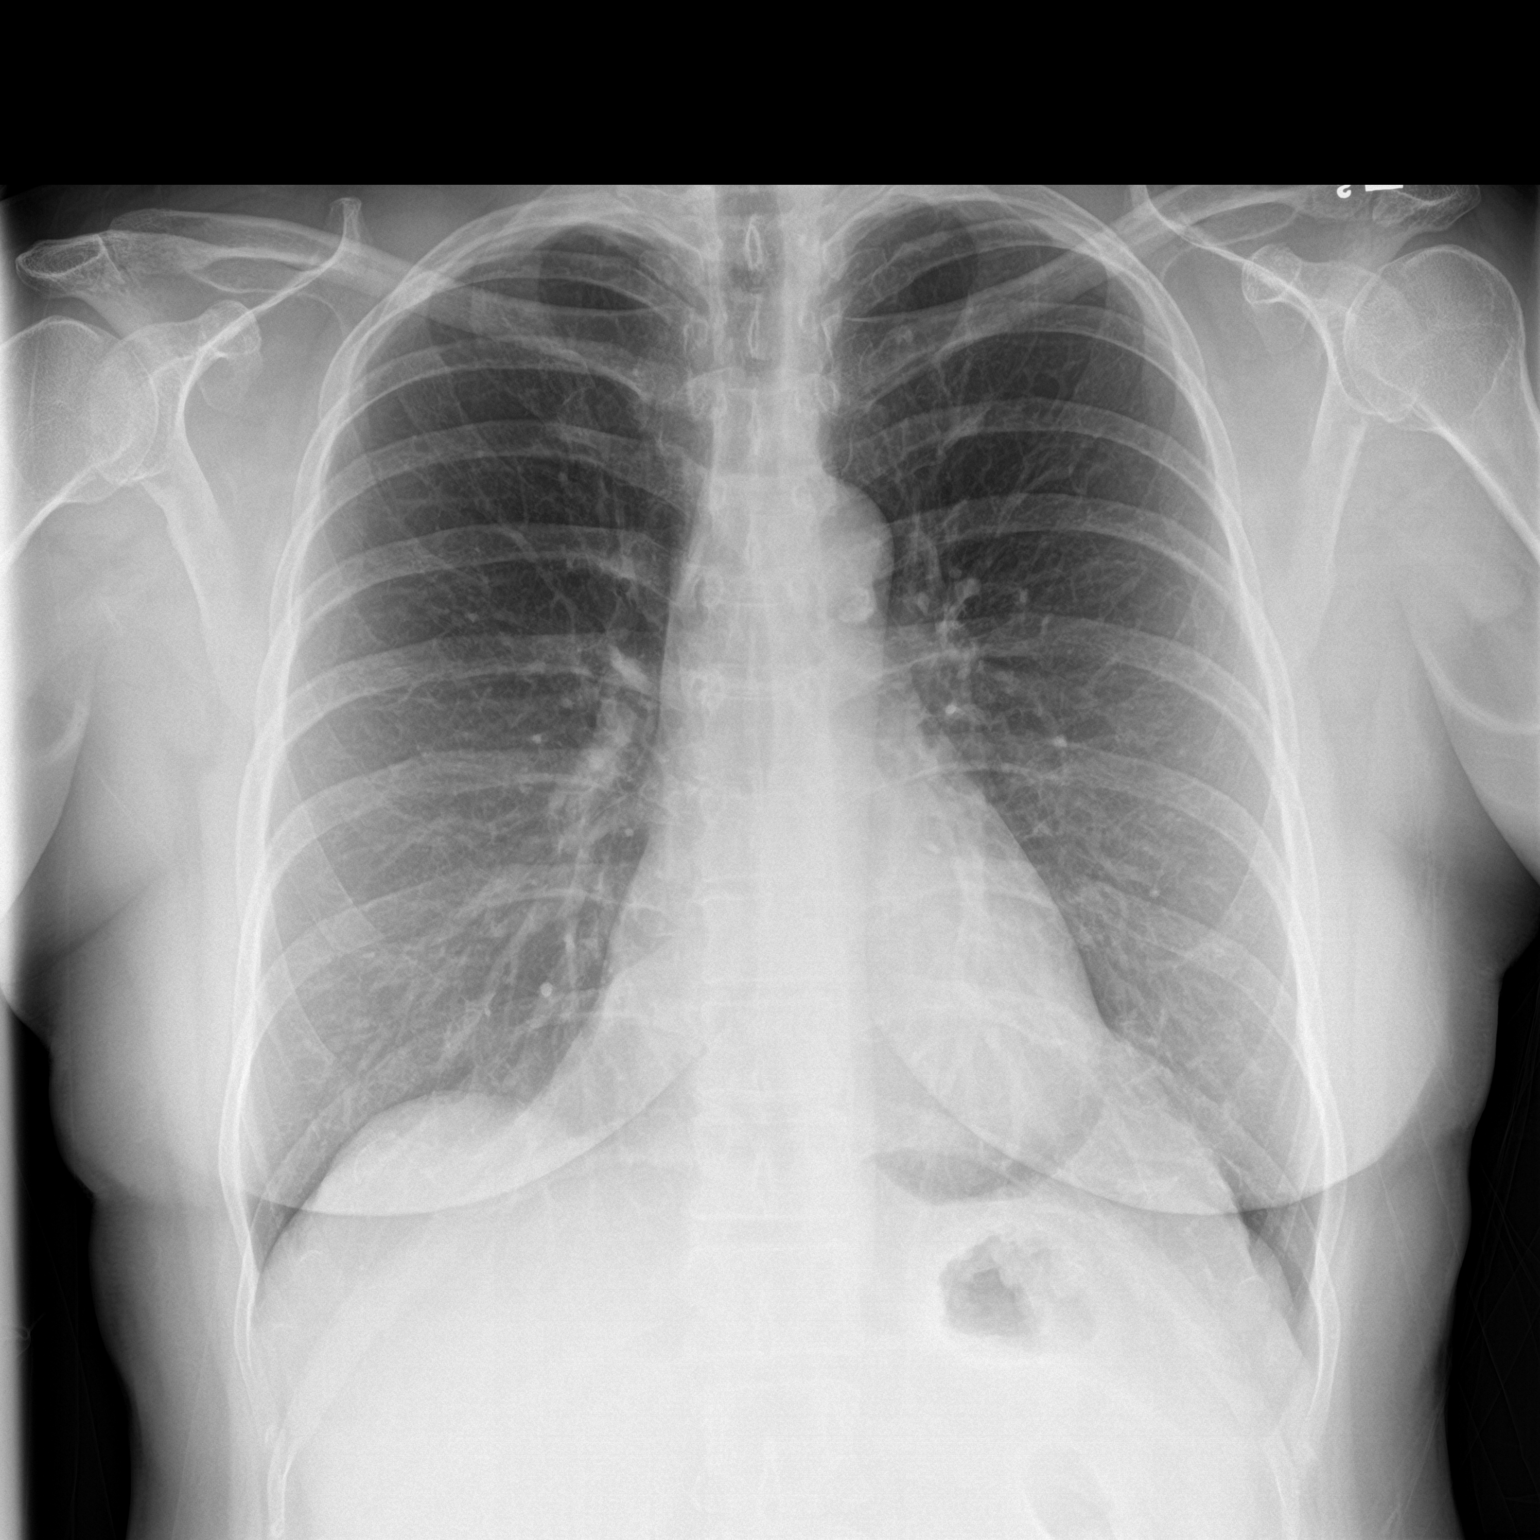
[im 2/2]
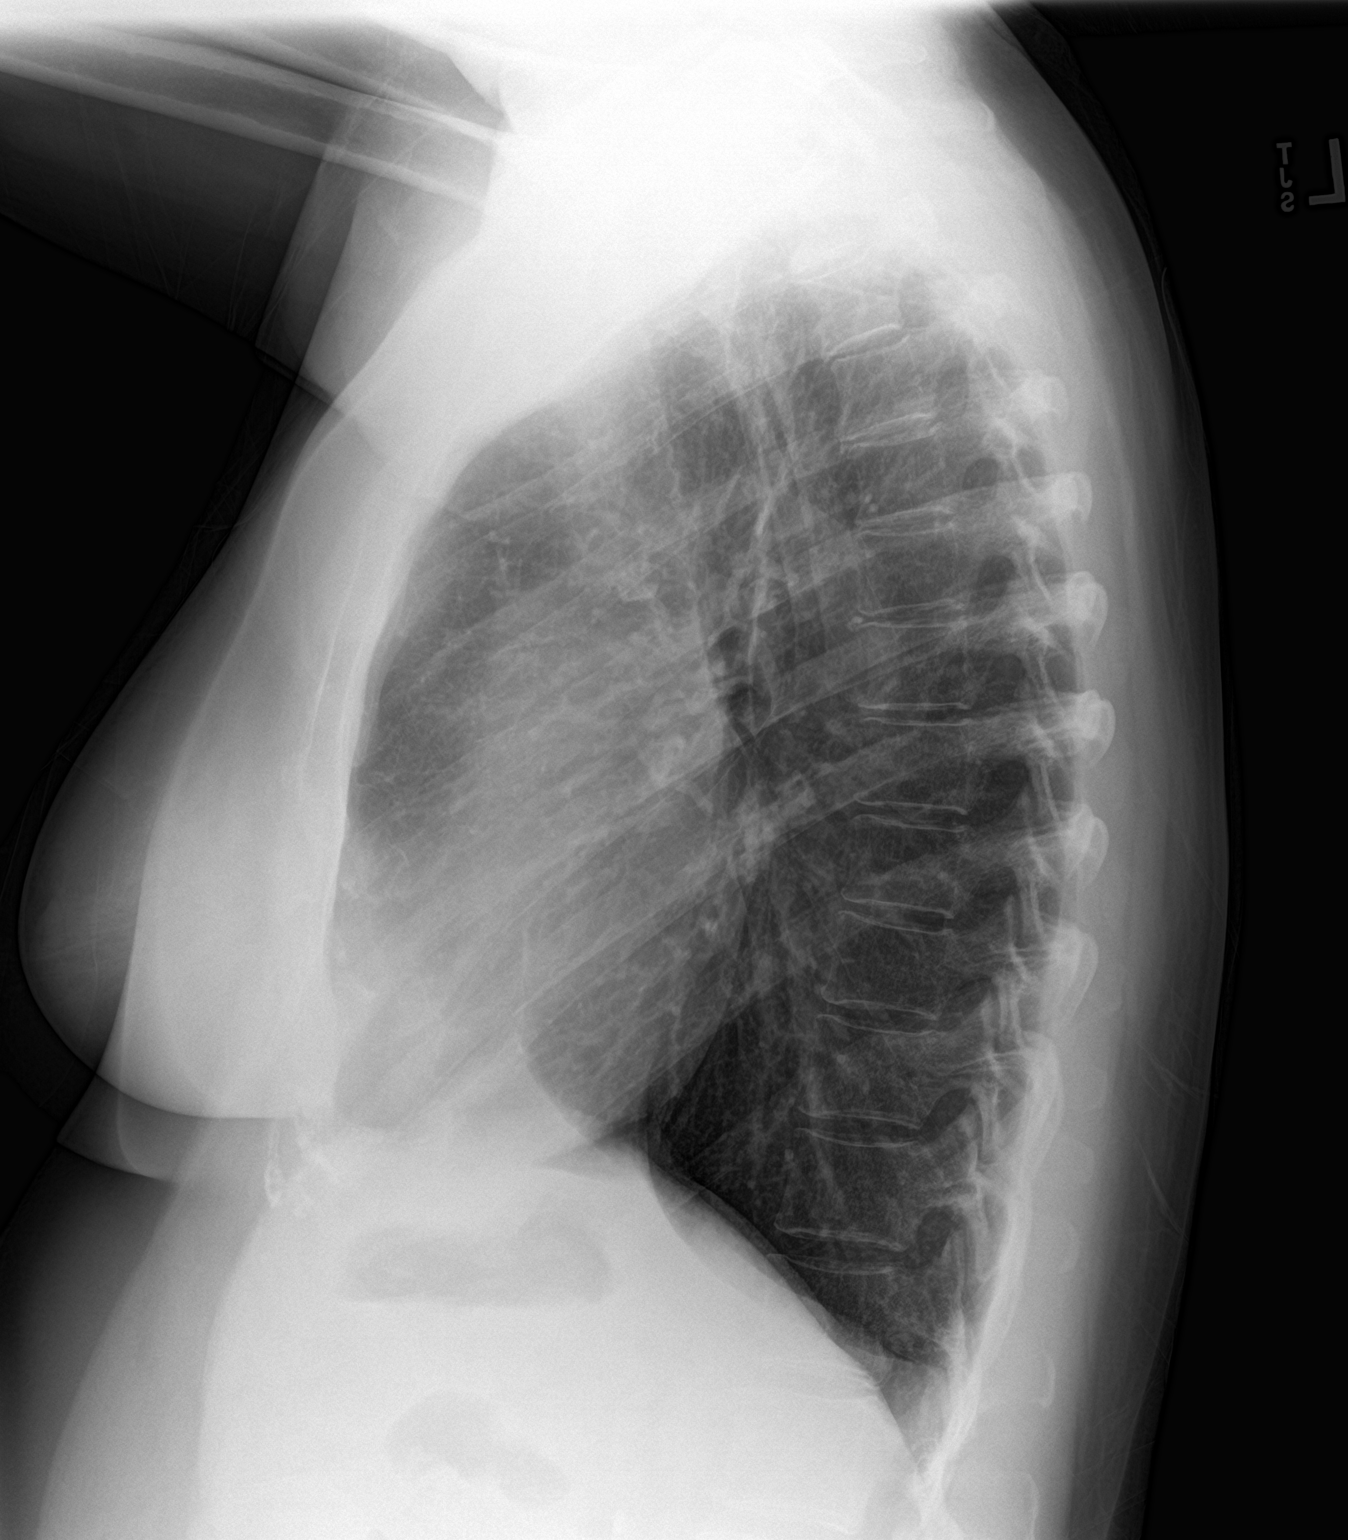

[2 of 2 positions shown; findings below may reference images not displayed]

FINDINGS: Normal heart size, mediastinal contours, and pulmonary vascularity.

Calcified lymph node at AP window.

Lungs clear.

No pulmonary infiltrate, pleural effusion, or pneumothorax.

Osseous structures unremarkable.
IMPRESSION: No acute abnormalities.

## 2021-10-06 ENCOUNTER — Ambulatory Visit: Payer: 59 | Admitting: Family Medicine

## 2021-10-06 ENCOUNTER — Encounter: Payer: Self-pay | Admitting: Family Medicine

## 2021-10-06 VITALS — BP 100/72 | HR 89 | Temp 98.3°F | Ht 66.0 in | Wt 168.1 lb

## 2021-10-06 DIAGNOSIS — L729 Follicular cyst of the skin and subcutaneous tissue, unspecified: Secondary | ICD-10-CM

## 2021-10-06 DIAGNOSIS — L089 Local infection of the skin and subcutaneous tissue, unspecified: Secondary | ICD-10-CM | POA: Diagnosis not present

## 2021-10-06 MED ORDER — DOXYCYCLINE HYCLATE 100 MG PO TABS: 100.0000 mg | ORAL_TABLET | Freq: Two times a day (BID) | ORAL | 0 refills | Status: DC

## 2021-10-06 NOTE — Patient Instructions (Addendum)
We will call with culture results.  Start warm compresses 3-4 times daily.  Start doxycyline 100 mg Twice  daily.  Call if not improving early next week for I and D.

## 2021-10-06 NOTE — Progress Notes (Signed)
  Patient ID: [name], female    DOB: 01/09/1961, 60 y.o.   MRN: [id number]  This visit was conducted in person.  BP 100/72   Pulse 89   Temp 98.3 F (36.8 C) (Oral)   Ht 5' 6" (1.676 m)   Wt 168 lb 1 oz (76.2 kg)   LMP 04/09/2012   SpO2 97%   BMI 27.13 kg/m    CC:  Chief Complaint  Patient presents with   Knot on Back of Head    Subjective:   HPI: [name] is a 60 y.o. female presenting on 10/06/2021 for Knot on Back of Head  She has noted smaller years ago.. increased in in size in last couple month.  Now in last  week are was increasingly painful. Area was red and warm.  No fever, no N/V. No flulike symptoms.   Yesterday she was pressing on the area.. had thick chunky white discharge.  Has been applying neosporin.      Relevant past medical, surgical, family and social history reviewed and updated as indicated. Interim medical history since our last visit reviewed. Allergies and medications reviewed and updated. Outpatient Medications Prior to Visit  Medication Sig Dispense Refill   budesonide (RHINOCORT ALLERGY) 32 MCG/ACT nasal spray Place 2 sprays into both nostrils daily. 15 mL 3   cetirizine (ZYRTEC) 10 MG tablet Take 1 tablet (10 mg total) by mouth daily. 90 tablet 3   IBUPROFEN PO Take by mouth as needed.     LORazepam (ATIVAN) 0.5 MG tablet Take 1 tablet (0.5 mg total) by mouth daily as needed for anxiety. 30 tablet 0   omeprazole (PRILOSEC) 20 MG capsule Take 1 capsule (20 mg total) by mouth daily. 90 capsule 3   triamcinolone cream (KENALOG) 0.1 % Apply 1 application topically 2 (two) times daily. 30 g 0   VITAMIN D PO Take 1 tablet by mouth daily.     COVID-19 At Home Antigen Test (CARESTART COVID-19 HOME TEST) KIT Use as directed 2 kit 0   guaiFENesin-codeine (ROBITUSSIN AC) 100-10 MG/5ML syrup Take 5 mLs by mouth 3 (three) times daily as needed for cough. 120 mL 0   oseltamivir (TAMIFLU) 75 MG capsule Take 1 capsule (75 mg total) by mouth 2  (two) times daily. 10 capsule 0   No facility-administered medications prior to visit.     Per HPI unless specifically indicated in ROS section below Review of Systems  Constitutional:  Negative for fatigue and fever.  HENT:  Negative for congestion.   Eyes:  Negative for pain.  Respiratory:  Negative for cough and shortness of breath.   Cardiovascular:  Negative for chest pain, palpitations and leg swelling.  Gastrointestinal:  Negative for abdominal pain.  Genitourinary:  Negative for dysuria and vaginal bleeding.  Musculoskeletal:  Negative for back pain.  Neurological:  Negative for syncope, light-headedness and headaches.  Psychiatric/Behavioral:  Negative for dysphoric mood.   Objective:  BP 100/72   Pulse 89   Temp 98.3 F (36.8 C) (Oral)   Ht 5' 6" (1.676 m)   Wt 168 lb 1 oz (76.2 kg)   LMP 04/09/2012   SpO2 97%   BMI 27.13 kg/m   Wt Readings from Last 3 Encounters:  10/06/21 168 lb 1 oz (76.2 kg)  06/07/21 168 lb (76.2 kg)  04/07/21 171 lb (77.6 kg)      Physical Exam Constitutional:      General: She is not in acute distress.      Appearance: Normal appearance. She is well-developed. She is not ill-appearing or toxic-appearing.  HENT:     Head: Normocephalic.     Right Ear: Hearing, tympanic membrane, ear canal and external ear normal. Tympanic membrane is not erythematous, retracted or bulging.     Left Ear: Hearing, tympanic membrane, ear canal and external ear normal. Tympanic membrane is not erythematous, retracted or bulging.     Nose: No mucosal edema or rhinorrhea.     Right Sinus: No maxillary sinus tenderness or frontal sinus tenderness.     Left Sinus: No maxillary sinus tenderness or frontal sinus tenderness.     Mouth/Throat:     Pharynx: Uvula midline.  Eyes:     General: Lids are normal. Lids are everted, no foreign bodies appreciated.     Conjunctiva/sclera: Conjunctivae normal.     Pupils: Pupils are equal, round, and reactive to light.   Neck:     Thyroid: No thyroid mass or thyromegaly.     Vascular: No carotid bruit.     Trachea: Trachea normal.  Cardiovascular:     Rate and Rhythm: Normal rate and regular rhythm.     Pulses: Normal pulses.     Heart sounds: Normal heart sounds, S1 normal and S2 normal. No murmur heard.   No friction rub. No gallop.  Pulmonary:     Effort: Pulmonary effort is normal. No tachypnea or respiratory distress.     Breath sounds: Normal breath sounds. No decreased breath sounds, wheezing, rhonchi or rales.  Abdominal:     General: Bowel sounds are normal.     Palpations: Abdomen is soft.     Tenderness: There is no abdominal tenderness.  Musculoskeletal:     Cervical back: Normal range of motion and neck supple.  Skin:    General: Skin is warm and dry.     Findings: No rash.     Comments: Half golf ball size mildly erythematous cyst on posterior scalp.   Minimally tender , discharge expressed for culture easily.  No associated lymphadenopathy in neck or head.  Neurological:     Mental Status: She is alert.  Psychiatric:        Mood and Affect: Mood is not anxious or depressed.        Speech: Speech normal.        Behavior: Behavior normal. Behavior is cooperative.        Thought Content: Thought content normal.        Judgment: Judgment normal.      Results for orders placed or performed in visit on 12/01/20  Comprehensive metabolic panel  Result Value Ref Range   Sodium 137 135 - 145 mEq/L   Potassium 3.9 3.5 - 5.1 mEq/L   Chloride 102 96 - 112 mEq/L   CO2 29 19 - 32 mEq/L   Glucose, Bld 106 (H) 70 - 99 mg/dL   BUN 13 6 - 23 mg/dL   Creatinine, Ser 0.93 0.40 - 1.20 mg/dL   Total Bilirubin 0.5 0.2 - 1.2 mg/dL   Alkaline Phosphatase 79 39 - 117 U/L   AST 21 0 - 37 U/L   ALT 32 0 - 35 U/L   Total Protein 6.8 6.0 - 8.3 g/dL   Albumin 4.2 3.5 - 5.2 g/dL   GFR 67.92 >60.00 mL/min   Calcium 9.1 8.4 - 10.5 mg/dL  Lipid panel  Result Value Ref Range   Cholesterol 212 (H) 0 -  200 mg/dL   Triglycerides 180.0 (H) 0.0 -  149.0 mg/dL   HDL 51.00 >39.00 mg/dL   VLDL 36.0 0.0 - 40.0 mg/dL   LDL Cholesterol 125 (H) 0 - 99 mg/dL   Total CHOL/HDL Ratio 4    NonHDL 161.42      COVID 19 screen:  No recent travel or known exposure to COVID19 The patient denies respiratory symptoms of COVID 19 at this time. The importance of social distancing was discussed today.   Assessment and Plan    Problem List Items Addressed This Visit     Infected cyst of skin - Primary    Acute, worsening  Area is draining so no clear indication for I&D today.  She will start warm compresses 3-4 times daily.  Wound culture was sent. 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  I will treat with doxycycline 100 mg p.o. twice daily x10 days.  She will return for possible I&D if cyst not improving in the next 48 to 72 hours.       Relevant Orders   WOUND CULTURE      , MD   

## 2021-10-06 NOTE — Assessment & Plan Note (Signed)
Acute, worsening  Area is draining so no clear indication for I&D today.  She will start warm compresses 3-4 times daily.  Wound culture was sent. No family or personal history of MRSA, but she works in [employer].  I will treat with doxycycline 100 mg p.o. twice daily x10 days.  She will return for possible I&D if cyst not improving in the next 48 to 72 hours.

## 2021-10-10 LAB — WOUND CULTURE
MICRO NUMBER:: 13476501
SPECIMEN QUALITY:: ADEQUATE

## 2021-10-11 ENCOUNTER — Encounter: Payer: Self-pay | Admitting: Family Medicine

## 2021-11-15 ENCOUNTER — Telehealth: Payer: Self-pay | Admitting: Family Medicine

## 2021-11-15 DIAGNOSIS — E7800 Pure hypercholesterolemia, unspecified: Secondary | ICD-10-CM

## 2021-11-15 DIAGNOSIS — E559 Vitamin D deficiency, unspecified: Secondary | ICD-10-CM

## 2021-11-15 DIAGNOSIS — R7303 Prediabetes: Secondary | ICD-10-CM

## 2021-11-15 NOTE — Telephone Encounter (Signed)
Tried [employer].  Phone number provided is not a working number.  I called [name] to verify if she had any upcoming imaging studies because I do not see any orders in her chart.  She states she thinks they have the wrong patient.  Will forward message to Amy to help figure this out.

## 2021-11-15 NOTE — Telephone Encounter (Signed)
-----   Message from Ellamae Sia sent at 11/06/2021  2:31 PM EDT ----- Regarding: Lab orders for Friday, 7.14.23 Patient is scheduled for CPX labs, please order future labs, Thanks , Karna Christmas

## 2021-11-15 NOTE — Telephone Encounter (Deleted)
[name] called from [facility] asking to see if its patient upper quadrant. Call back number [phone]

## 2021-11-15 NOTE — Telephone Encounter (Signed)
Correct chart notated and message sent to the right patient

## 2021-11-17 ENCOUNTER — Ambulatory Visit: Payer: 59 | Admitting: Family Medicine

## 2021-11-17 ENCOUNTER — Other Ambulatory Visit: Payer: 59

## 2021-11-24 ENCOUNTER — Ambulatory Visit: Payer: 59 | Admitting: Family Medicine

## 2021-12-01 ENCOUNTER — Ambulatory Visit: Payer: 59 | Admitting: Family Medicine

## 2021-12-29 ENCOUNTER — Other Ambulatory Visit (HOSPITAL_COMMUNITY)
Admission: RE | Admit: 2021-12-29 | Discharge: 2021-12-29 | Disposition: A | Discharge status: Home / Self Care | Payer: 59 | Source: Ambulatory Visit | Attending: Family Medicine | Admitting: Family Medicine

## 2021-12-29 ENCOUNTER — Encounter: Payer: Self-pay | Admitting: Family Medicine

## 2021-12-29 ENCOUNTER — Ambulatory Visit (INDEPENDENT_AMBULATORY_CARE_PROVIDER_SITE_OTHER): Payer: 59 | Admitting: Family Medicine

## 2021-12-29 VITALS — BP 110/66 | HR 87 | Temp 99.2°F | Ht 66.25 in | Wt 167.0 lb

## 2021-12-29 DIAGNOSIS — Z124 Encounter for screening for malignant neoplasm of cervix: Secondary | ICD-10-CM | POA: Diagnosis not present

## 2021-12-29 DIAGNOSIS — L659 Nonscarring hair loss, unspecified: Secondary | ICD-10-CM | POA: Insufficient documentation

## 2021-12-29 DIAGNOSIS — F3340 Major depressive disorder, recurrent, in remission, unspecified: Secondary | ICD-10-CM | POA: Diagnosis not present

## 2021-12-29 DIAGNOSIS — Z0000 Encounter for general adult medical examination without abnormal findings: Secondary | ICD-10-CM

## 2021-12-29 DIAGNOSIS — E7800 Pure hypercholesterolemia, unspecified: Secondary | ICD-10-CM

## 2021-12-29 DIAGNOSIS — E559 Vitamin D deficiency, unspecified: Secondary | ICD-10-CM

## 2021-12-29 DIAGNOSIS — R7303 Prediabetes: Secondary | ICD-10-CM | POA: Diagnosis not present

## 2021-12-29 DIAGNOSIS — Z130 Encounter for screening for diseases of the blood and blood-forming organs and certain disorders involving the immune mechanism: Secondary | ICD-10-CM | POA: Insufficient documentation

## 2021-12-29 NOTE — Assessment & Plan Note (Signed)
Resolved

## 2021-12-29 NOTE — Progress Notes (Signed)
Patient ID: [name], female    DOB: 1960-11-13, 61 y.o.   MRN: [id number]  This visit was conducted in person.  BP 110/66   Pulse 87   Temp 99.2 F (37.3 C) (Oral)   Ht 5' 6.25" (1.683 m)   Wt 167 lb (75.8 kg)   LMP 04/09/2012   SpO2 97%   BMI 26.75 kg/m    CC:  Chief Complaint  Patient presents with   Annual Exam    Subjective:   HPI: [name] is a 61 y.o. female presenting on 12/29/2021 for Annual Exam   MDD:  Well controlled. NO longer needing alprazolam. [place] [facility] Visit from 12/02/2020 in [place] at [facility] Total Score 0      Reviewed labs in detail with patient.  Diet: moderate  Exercise: Off and on Wt Readings from Last 3 Encounters:  12/29/21 167 lb (75.8 kg)  10/06/21 168 lb 1 oz (76.2 kg)  06/07/21 168 lb (76.2 kg)   Body mass index is 26.75 kg/m.   Elevated Cholesterol/prediabetes:Due for re-eval. Using medications without problems: Muscle aches:  Diet compliance: Exercise: Other complaints:  Vit D def: due for re-eval.     Relevant past medical, surgical, family and social history reviewed and updated as indicated. Interim medical history since our last visit reviewed. Allergies and medications reviewed and updated. Outpatient Medications Prior to Visit  Medication Sig Dispense Refill   budesonide (RHINOCORT ALLERGY) 32 MCG/ACT nasal spray Place 2 sprays into both nostrils daily. 15 mL 3   cetirizine (ZYRTEC) 10 MG tablet Take 1 tablet (10 mg total) by mouth daily. 90 tablet 3   IBUPROFEN PO Take by mouth as needed.     LORazepam (ATIVAN) 0.5 MG tablet Take 1 tablet (0.5 mg total) by mouth daily as needed for anxiety. 30 tablet 0   omeprazole (PRILOSEC) 20 MG capsule Take 1 capsule (20 mg total) by mouth daily. 90 capsule 3   triamcinolone cream (KENALOG) 0.1 % Apply 1 application topically 2 (two) times daily. 30 g 0   VITAMIN D PO Take 1 tablet by mouth daily.     doxycycline (VIBRA-TABS) 100  MG tablet Take 1 tablet (100 mg total) by mouth 2 (two) times daily. 20 tablet 0   No facility-administered medications prior to visit.     Per HPI unless specifically indicated in ROS section below Review of Systems  Constitutional:  Negative for fatigue and fever.  HENT:  Negative for congestion.   Eyes:  Negative for pain.  Respiratory:  Negative for cough and shortness of breath.   Cardiovascular:  Negative for chest pain, palpitations and leg swelling.  Gastrointestinal:  Negative for abdominal pain.  Genitourinary:  Negative for dysuria and vaginal bleeding.  Musculoskeletal:  Negative for back pain.  Neurological:  Negative for syncope, light-headedness and headaches.  Psychiatric/Behavioral:  Negative for dysphoric mood.    Objective:  BP 110/66   Pulse 87   Temp 99.2 F (37.3 C) (Oral)   Ht 5' 6.25" (1.683 m)   Wt 167 lb (75.8 kg)   LMP 04/09/2012   SpO2 97%   BMI 26.75 kg/m   Wt Readings from Last 3 Encounters:  12/29/21 167 lb (75.8 kg)  10/06/21 168 lb 1 oz (76.2 kg)  06/07/21 168 lb (76.2 kg)      Physical Exam Constitutional:      General: She is not in acute distress.    Appearance: Normal  appearance. She is well-developed. She is not ill-appearing or toxic-appearing.  HENT:     Head: Normocephalic.     Right Ear: Hearing, tympanic membrane, ear canal and external ear normal.     Left Ear: Hearing, tympanic membrane, ear canal and external ear normal.     Nose: Nose normal.  Eyes:     General: Lids are normal. Lids are everted, no foreign bodies appreciated.     Conjunctiva/sclera: Conjunctivae normal.     Pupils: Pupils are equal, round, and reactive to light.  Neck:     Thyroid: No thyroid mass or thyromegaly.     Vascular: No carotid bruit.     Trachea: Trachea normal.  Cardiovascular:     Rate and Rhythm: Normal rate and regular rhythm.     Heart sounds: Normal heart sounds, S1 normal and S2 normal. No murmur heard.    No gallop.  Pulmonary:      Effort: Pulmonary effort is normal. No respiratory distress.     Breath sounds: Normal breath sounds. No wheezing, rhonchi or rales.  Abdominal:     General: Bowel sounds are normal. There is no distension or abdominal bruit.     Palpations: Abdomen is soft. There is no fluid wave or mass.     Tenderness: There is no abdominal tenderness. There is no guarding or rebound.     Hernia: No hernia is present.  Genitourinary:    Exam position: Supine.     Labia:        Right: No rash, tenderness or lesion.        Left: No rash, tenderness or lesion.      Vagina: Normal.     Cervix: No cervical motion tenderness, discharge or friability.     Uterus: Not enlarged and not tender.      Adnexa:        Right: No mass, tenderness or fullness.         Left: No mass, tenderness or fullness.    Musculoskeletal:     Cervical back: Normal range of motion and neck supple.  Lymphadenopathy:     Cervical: No cervical adenopathy.  Skin:    General: Skin is warm and dry.     Findings: No rash.  Neurological:     Mental Status: She is alert.     Cranial Nerves: No cranial nerve deficit.     Sensory: No sensory deficit.  Psychiatric:        Mood and Affect: Mood is not anxious or depressed.        Speech: Speech normal.        Behavior: Behavior normal. Behavior is cooperative.        Judgment: Judgment normal.       Results for orders placed or performed in visit on 10/06/21  WOUND CULTURE   Specimen: Wound  Result Value Ref Range   MICRO NUMBER: [id number]    SPECIMEN QUALITY: Adequate    SOURCE: SCALP    STATUS: FINAL    GRAM STAIN:      Few White blood cells seen Few epithelial cells Few Gram positive bacilli Few Gram positive cocci in pairs   RESULT:      Growth of skin flora (note: Growth does not include S. aureus, beta-hemolytic Streptococci or P. aeruginosa).     COVID 19 screen:  No recent travel or known exposure to COVID19 The patient denies respiratory symptoms of COVID 19  at this time. The importance of  social distancing was discussed today.   Assessment and Plan   The patient's preventative maintenance and recommended screening tests for an annual wellness exam were reviewed in full today. Brought up to date unless services declined.  Counselled on the importance of diet, exercise, and its role in overall health and mortality. The patient's FH and SH was reviewed, including their home life, tobacco status, and drug and alcohol status.    Vaccines:  Tdap  uptodate.  Consider shingrix. Recommended flu vaccine Discussed COVID19 vaccine  booster side effects and benefits. Strongly encouraged the patient to get the vaccine. Questions answered.  PAP/DVE: 02/2016 nml and no HPV, repeat in 5 years... DUE TODAY  MAMMO: 01/2021 low risk, will, schedule.  Colon: 10/28/2020 repeat in 10 years Hep C neg. Nonsmoker  STD screen: refused  Problem List Items Addressed This Visit     Alopecia   MDD (recurrent major depressive disorder) in remission ([place])    Resolved.       Screening, iron deficiency anemia   Relevant Orders   CBC with Differential/Platelet   Other Visit Diagnoses     Routine general medical examination at a health care facility    -  Primary   Cervical cancer screening       Relevant Orders   Cytology - PAP([place])   Hypercholesterolemia       Vitamin D deficiency       Prediabetes          Labs for cholesterol, vit d, A1C already ordered. Eliezer Lofts, MD

## 2022-01-03 LAB — CYTOLOGY - PAP
Adequacy: ABSENT
Comment: NEGATIVE
Diagnosis: NEGATIVE
High risk HPV: NEGATIVE

## 2022-02-28 ENCOUNTER — Ambulatory Visit: Payer: 59 | Admitting: Family Medicine

## 2022-02-28 ENCOUNTER — Other Ambulatory Visit: Payer: Self-pay

## 2022-02-28 ENCOUNTER — Encounter: Payer: Self-pay | Admitting: Family Medicine

## 2022-02-28 VITALS — BP 130/70 | HR 86 | Temp 98.1°F | Ht 66.25 in | Wt 167.5 lb

## 2022-02-28 DIAGNOSIS — M7061 Trochanteric bursitis, right hip: Secondary | ICD-10-CM | POA: Diagnosis not present

## 2022-02-28 MED ORDER — OMEPRAZOLE 20 MG PO CPDR
20.0000 mg | DELAYED_RELEASE_CAPSULE | Freq: Every day | ORAL | 3 refills | Status: DC
  Filled 2022-02-28 – 2022-04-06 (×2): qty 90, 90d supply, fill #0

## 2022-02-28 MED ORDER — TRIAMCINOLONE ACETONIDE 40 MG/ML IJ SUSP
40.0000 mg | Freq: Once | INTRAMUSCULAR | Status: AC
  Administered 2022-02-28: 40 mg via INTRA_ARTICULAR

## 2022-02-28 NOTE — Progress Notes (Unsigned)
Theador Jezewski T. Allesha Aronoff, MD, [place] at [facility] [place], [address]  Phone: [phone]  FAX: [phone]  [name] - 61 y.o. female  MRN [id number]  Date of Birth: [birth year]  Date: 02/28/2022  PCP: Jinny Sanders, MD  Referral: Jinny Sanders, MD  Chief Complaint  Patient presents with   Hip Pain    Right   Subjective:   [name] is a 61 y.o. very pleasant female patient with Body mass index is 26.83 kg/m. who presents with the following:  Lateral hip pain.  This isolates to the right lateral leg.  This is at the thigh around the trochanteric bursa and just caudal to this.  She denies any groin pain.  She denies any back pain.  She denies any radicular pain.  No numbness, tingling, or focal weakness.  Went to [employer] and sometimes will go down into the leg.  This is predominantly all on the lateral side in and about the lateral hip.  She denies left-sided hip pain.  Inject R GTB  Review of Systems is noted in the HPI, as appropriate  Objective:   BP 130/70   Pulse 86   Temp 98.1 F (36.7 C) (Oral)   Ht 5' 6.25" (1.683 m)   Wt 167 lb 8 oz (76 kg)   LMP 04/09/2012   SpO2 99%   BMI 26.83 kg/m   GEN: No acute distress; alert,appropriate. PULM: Breathing comfortably in no respiratory distress PSYCH: Normally interactive.    HIP EXAM: SIDE: R ROM: Abduction, Flexion, Internal and External range of motion: Approaching full motion Pain with terminal IROM and EROM: Minimal GTB: Tender to palpate on the right SLR: NEG Knees: No effusion FABER: NT REVERSE FABER: NT, neg Piriformis: NT at direct palpation Str: flexion: 5/5 abduction: 5/5 adduction: 5/5 Strength testing non-tender    Laboratory and Imaging Data:  Assessment and Plan:     ICD-10-CM   1. Trochanteric bursitis of right hip  M70.61 triamcinolone acetonide (KENALOG-40) injection 40 mg     I reviewed with the patient  the structures involved and how they related to diagnosis.   Emphasized importance of strong abductors as well as core support  Trochanteric bursa injections have clearly been shown to help with acute bursitis, and the patient would benefit.   Aspiration/Injection Procedure Note [name] Jan 11, 1961 Date of procedure: 02/28/2022  Procedure: Large Joint Aspiration / Injection of Hip, Trochanteric Bursa, R Indications: Pain  Procedure Details Verbal consent obtained. Risks, benefits, and alternatives reviewed. Greater trochanter sterilely prepped with Chloraprep. Ethyl Chloride used for anesthesia. 9 cc of Lidocaine 1% injected with 1 mL of Kenalog 40 mg into trochanteric bursa at area of maximal tenderness at greater trochanter. Needle taken to bone to troch bursa, flows easily. Bursa massaged. No bleeding and no complications. Decreased pain after injection. Needle: 22 gauge spinal needle Medication: 1 mL of Kenalog 40 mg   Medication Management during today's office visit: Meds ordered this encounter  Medications   omeprazole (PRILOSEC) 20 MG capsule    Sig: Take 1 capsule (20 mg total) by mouth daily.    Dispense:  90 capsule    Refill:  3   triamcinolone acetonide (KENALOG-40) injection 40 mg   Medications Discontinued During This Encounter  Medication Reason   omeprazole (PRILOSEC) 20 MG capsule Reorder    Orders placed today for conditions managed today: No orders of the defined  types were placed in this encounter.   Disposition: No follow-ups on file.  [employer] speech-to-text software was used for transcription in this dictation.  Possible transcriptional errors can occur using Editor, commissioning.   Signed,  Maud Deed. Shawntee Mainwaring, MD   Outpatient Encounter Medications as of 02/28/2022  Medication Sig   budesonide (RHINOCORT ALLERGY) 32 MCG/ACT nasal spray Place 2 sprays into both nostrils daily.   cetirizine (ZYRTEC) 10 MG tablet Take 1 tablet (10 mg  total) by mouth daily.   IBUPROFEN PO Take by mouth as needed.   LORazepam (ATIVAN) 0.5 MG tablet Take 1 tablet (0.5 mg total) by mouth daily as needed for anxiety.   omeprazole (PRILOSEC) 20 MG capsule Take 1 capsule (20 mg total) by mouth daily.   triamcinolone cream (KENALOG) 0.1 % Apply 1 application topically 2 (two) times daily.   VITAMIN D PO Take 1 tablet by mouth daily.   [DISCONTINUED] omeprazole (PRILOSEC) 20 MG capsule Take 1 capsule (20 mg total) by mouth daily.   [EXPIRED] triamcinolone acetonide (KENALOG-40) injection 40 mg    No facility-administered encounter medications on file as of 02/28/2022.

## 2022-03-16 ENCOUNTER — Other Ambulatory Visit: Payer: Self-pay

## 2022-04-06 ENCOUNTER — Other Ambulatory Visit: Payer: Self-pay

## 2022-04-17 ENCOUNTER — Other Ambulatory Visit: Payer: Self-pay | Admitting: Family Medicine

## 2022-04-17 DIAGNOSIS — Z1231 Encounter for screening mammogram for malignant neoplasm of breast: Secondary | ICD-10-CM

## 2022-04-25 ENCOUNTER — Ambulatory Visit: Payer: 59 | Admitting: Family Medicine

## 2022-04-25 VITALS — BP 110/70 | HR 88 | Temp 97.6°F | Ht 66.25 in | Wt 169.0 lb

## 2022-04-25 DIAGNOSIS — B9789 Other viral agents as the cause of diseases classified elsewhere: Secondary | ICD-10-CM

## 2022-04-25 DIAGNOSIS — N6452 Nipple discharge: Secondary | ICD-10-CM

## 2022-04-25 DIAGNOSIS — J329 Chronic sinusitis, unspecified: Secondary | ICD-10-CM | POA: Diagnosis not present

## 2022-04-25 DIAGNOSIS — J029 Acute pharyngitis, unspecified: Secondary | ICD-10-CM | POA: Diagnosis not present

## 2022-04-25 LAB — POC COVID19 BINAXNOW: SARS Coronavirus 2 Ag: NEGATIVE

## 2022-04-25 MED ORDER — AZITHROMYCIN 250 MG PO TABS: ORAL_TABLET | ORAL | 0 refills | Status: DC

## 2022-04-25 MED ORDER — PREDNISONE 10 MG PO TABS: ORAL_TABLET | ORAL | 0 refills | Status: DC

## 2022-04-25 NOTE — Assessment & Plan Note (Signed)
Acute, no mass noted, unable to express discharge in exam. Refer for diagnostic mammogram and [place] to evaluate.   No S/S of mastitis or abscess.

## 2022-04-25 NOTE — Progress Notes (Signed)
Patient ID: [name], female    DOB: [birth year], 61 y.o.   MRN: [id number]  This visit was conducted in person.  BP 110/70 (BP Location: Left Arm, Patient Position: Sitting)   Pulse 88   Temp 97.6 F (36.4 C) (Skin)   Ht 5' 6.25" (1.683 m)   Wt 169 lb (76.7 kg)   LMP 04/09/2012   SpO2 98%   BMI 27.07 kg/m    CC:  Chief Complaint  Patient presents with   Breast Problem    Discharge, noticed last week,   Sinusitis    Subjective:   HPI: [name] is a 61 y.o. female presenting on 04/25/2022 for Breast Problem (Discharge, noticed last week,) and Sinusitis  New onset  green discharge in last week, 2-3 times, now resolved. Non bloody. Feeling of milk letting down.. only was expressed. No mass, no nipple skin changes. No breast pain.    Last mammogram 01/2021.. scheduled for 06/15/2022   She has also noted in last 24 hours severe ST, now progressed to facial pressure and pain.  Nasal congestion.  No fever, no SOB, no wheeze.  No ear pain.  She has been using Rhinocort, Claritin and  Advil.  No sick contacts.   Relevant past medical, surgical, family and social history reviewed and updated as indicated. Interim medical history since our last visit reviewed. Allergies and medications reviewed and updated. Outpatient Medications Prior to Visit  Medication Sig Dispense Refill   budesonide (RHINOCORT ALLERGY) 32 MCG/ACT nasal spray Place 2 sprays into both nostrils daily. 15 mL 3   cetirizine (ZYRTEC) 10 MG tablet Take 1 tablet (10 mg total) by mouth daily. 90 tablet 3   IBUPROFEN PO Take by mouth as needed.     LORazepam (ATIVAN) 0.5 MG tablet Take 1 tablet (0.5 mg total) by mouth daily as needed for anxiety. 30 tablet 0   omeprazole (PRILOSEC) 20 MG capsule Take 1 capsule (20 mg total) by mouth daily. 90 capsule 3   triamcinolone cream (KENALOG) 0.1 % Apply 1 application topically 2 (two) times daily. 30 g 0   VITAMIN D PO Take 1 tablet by mouth daily.     No  facility-administered medications prior to visit.     Per HPI unless specifically indicated in ROS section below Review of Systems  Constitutional:  Negative for fatigue and fever.  HENT:  Positive for congestion, sinus pressure and sore throat.   Eyes:  Negative for pain.  Respiratory:  Negative for cough and shortness of breath.   Cardiovascular:  Negative for chest pain, palpitations and leg swelling.  Gastrointestinal:  Negative for abdominal pain.  Genitourinary:  Negative for dysuria and vaginal bleeding.  Musculoskeletal:  Negative for back pain.  Neurological:  Negative for syncope, light-headedness and headaches.  Psychiatric/Behavioral:  Negative for dysphoric mood.    Objective:  BP 110/70 (BP Location: Left Arm, Patient Position: Sitting)   Pulse 88   Temp 97.6 F (36.4 C) (Skin)   Ht 5' 6.25" (1.683 m)   Wt 169 lb (76.7 kg)   LMP 04/09/2012   SpO2 98%   BMI 27.07 kg/m   Wt Readings from Last 3 Encounters:  04/25/22 169 lb (76.7 kg)  02/28/22 167 lb 8 oz (76 kg)  12/29/21 167 lb (75.8 kg)      Physical Exam Constitutional:      General: She is not in acute distress.    Appearance: Normal appearance. She is well-developed. She is  not ill-appearing or toxic-appearing.  HENT:     Head: Normocephalic.     Right Ear: Hearing, tympanic membrane, ear canal and external ear normal. Tympanic membrane is not erythematous, retracted or bulging.     Left Ear: Hearing, tympanic membrane, ear canal and external ear normal. Tympanic membrane is not erythematous, retracted or bulging.     Nose: No mucosal edema or rhinorrhea.     Right Sinus: No maxillary sinus tenderness or frontal sinus tenderness.     Left Sinus: No maxillary sinus tenderness or frontal sinus tenderness.     Mouth/Throat:     Pharynx: Uvula midline.  Eyes:     General: Lids are normal. Lids are everted, no foreign bodies appreciated.     Conjunctiva/sclera: Conjunctivae normal.     Pupils: Pupils are  equal, round, and reactive to light.  Neck:     Thyroid: No thyroid mass or thyromegaly.     Vascular: No carotid bruit.     Trachea: Trachea normal.  Cardiovascular:     Rate and Rhythm: Normal rate and regular rhythm.     Pulses: Normal pulses.     Heart sounds: Normal heart sounds, S1 normal and S2 normal. No murmur heard.    No friction rub. No gallop.  Pulmonary:     Effort: Pulmonary effort is normal. No tachypnea or respiratory distress.     Breath sounds: Normal breath sounds. No decreased breath sounds, wheezing, rhonchi or rales.  Chest:  Breasts:    Breasts are symmetrical.     Right: Absent. No swelling, inverted nipple, nipple discharge, skin change or tenderness.     Left: Absent. No inverted nipple, nipple discharge, skin change or tenderness.  Abdominal:     General: Bowel sounds are normal.     Palpations: Abdomen is soft.     Tenderness: There is no abdominal tenderness.  Musculoskeletal:     Cervical back: Normal range of motion and neck supple.  Lymphadenopathy:     Upper Body:     Right upper body: No supraclavicular or axillary adenopathy.     Left upper body: No supraclavicular or axillary adenopathy.  Skin:    General: Skin is warm and dry.     Findings: No rash.  Neurological:     Mental Status: She is alert.  Psychiatric:        Mood and Affect: Mood is not anxious or depressed.        Speech: Speech normal.        Behavior: Behavior normal. Behavior is cooperative.        Thought Content: Thought content normal.        Judgment: Judgment normal.       Results for orders placed or performed in visit on 12/29/21  Cytology - PAP([place])  Result Value Ref Range   High risk HPV Negative    Adequacy      Satisfactory for evaluation; transformation zone component ABSENT.   Diagnosis      - Negative for intraepithelial lesion or malignancy (NILM)   Comment Normal Reference Range HPV - Negative      COVID 19 screen:  No recent travel or  known exposure to COVID19 The patient denies respiratory symptoms of COVID 19 at this time. The importance of social distancing was discussed today.   Assessment and Plan    Problem List Items Addressed This Visit     Nipple discharge - Primary    Acute, no mass noted,  unable to express discharge in exam. Refer for diagnostic mammogram and [place] to evaluate.   No S/S of mastitis or abscess.      Relevant Orders   MS DIGITAL DIAGNOSTIC BILATERAL   US BREAST LTD UNI LEFT INC AXILLA   Viral sinusitis    Check COVID test today as well as on day 4-5 of illness.  Will treat with prednisone taper, ands nasal saline.  If not improving by day 3-5 of illness.. fill rx for antibitoics ( z-pack)      Relevant Medications   predniSONE (DELTASONE) 10 MG tablet   azithromycin (ZITHROMAX) 250 MG tablet   Other Visit Diagnoses     Sore throat       Relevant Orders   POC COVID-19      Meds ordered this encounter  Medications   predniSONE (DELTASONE) 10 MG tablet    Sig: 3 tabs by mouth daily x 3 days, then 2 tabs by mouth daily x 2 days then 1 tab by mouth daily x 2 days    Dispense:  15 tablet    Refill:  0   azithromycin (ZITHROMAX) 250 MG tablet    Sig: 2 tab po x 1 day then 1 tab po daily    Dispense:  6 tablet    Refill:  0    Fill if not improving  after 5-7 days of illness.     Eliezer Lofts, MD

## 2022-04-25 NOTE — Progress Notes (Signed)
Patient advised by telephone that covid test was negative and to retest per Dr. Rometta Emery instructions.

## 2022-04-25 NOTE — Assessment & Plan Note (Signed)
Check COVID test today as well as on day 4-5 of illness.  Will treat with prednisone taper, ands nasal saline.  If not improving by day 3-5 of illness.. fill rx for antibitoics ( z-pack)

## 2022-04-25 NOTE — Patient Instructions (Addendum)
We will call to set up diagnostic mammogram and ultrasound.  Start prednisone taper.  Hold Rhinocort, use nasal saline. Use tylenol  for headache.  Repeat COVID testing day 3-5 of illness if not improving.  If negative COVID test,  fill antibiotics

## 2022-04-27 ENCOUNTER — Ambulatory Visit
Admission: RE | Admit: 2022-04-27 | Discharge: 2022-04-27 | Disposition: A | Discharge status: Home / Self Care | Payer: 59 | Source: Ambulatory Visit | Attending: Family Medicine | Admitting: Family Medicine

## 2022-04-27 DIAGNOSIS — R922 Inconclusive mammogram: Secondary | ICD-10-CM | POA: Diagnosis not present

## 2022-04-27 DIAGNOSIS — N6452 Nipple discharge: Secondary | ICD-10-CM

## 2022-06-15 DIAGNOSIS — Z1231 Encounter for screening mammogram for malignant neoplasm of breast: Secondary | ICD-10-CM

## 2023-01-29 ENCOUNTER — Encounter: Payer: Self-pay | Admitting: Family Medicine

## 2023-01-29 ENCOUNTER — Ambulatory Visit: Payer: Commercial Managed Care - PPO | Admitting: Family Medicine

## 2023-01-29 ENCOUNTER — Other Ambulatory Visit: Payer: Self-pay

## 2023-01-29 VITALS — BP 124/76 | HR 107 | Temp 97.7°F | Ht 66.25 in | Wt 160.2 lb

## 2023-01-29 DIAGNOSIS — U071 COVID-19: Secondary | ICD-10-CM | POA: Diagnosis not present

## 2023-01-29 DIAGNOSIS — R509 Fever, unspecified: Secondary | ICD-10-CM | POA: Diagnosis not present

## 2023-01-29 DIAGNOSIS — R051 Acute cough: Secondary | ICD-10-CM

## 2023-01-29 LAB — POCT INFLUENZA A/B
Influenza A, POC: NEGATIVE
Influenza B, POC: NEGATIVE

## 2023-01-29 LAB — POC COVID19 BINAXNOW: SARS Coronavirus 2 Ag: POSITIVE — AB

## 2023-01-29 MED ORDER — GUAIFENESIN-CODEINE 100-10 MG/5ML PO SYRP: 5.0000 mL | ORAL_SOLUTION | Freq: Three times a day (TID) | ORAL | 0 refills | Status: DC | PRN

## 2023-01-29 NOTE — Patient Instructions (Addendum)
You have COVID infection  Recommend stay at home in isolation if able (use separate bathroom, bedroom from unaffected individuals at home) until feeling better and fever free for 24 hours off fever reducing medicine then may leave the house, but recommend mask wearing for another 5 days when around anyone.  Push fluids and rest. Consider vit C 500mg  twice daily, vit D 2000 IU daily, zinc 100mg  daily, tylenol as needed Let us know if any worsening symptoms to consider Paxlovid antiviral.

## 2023-01-29 NOTE — Progress Notes (Signed)
Ph: [phone] Fax: [phone]   Patient ID: [name], female    DOB: 02/27/1961, 62 y.o.   MRN: [id number]  This visit was conducted in person.  BP 124/76   Pulse (!) 107   Temp 97.7 F (36.5 C) (Temporal)   Ht 5' 6.25" (1.683 m)   Wt 160 lb 4 oz (72.7 kg)   LMP 04/09/2012   SpO2 97%   BMI 25.67 kg/m    CC: fever, cough  Subjective:   HPI: [name] is a 62 y.o. female presenting on 01/29/2023 for Fever (C/o fever- max 101.8, cough, body aches, HA, runny nose, nausea, vomiting and SOB. Sxs started  01/26/23. Tried Advil and saline solution, not helpful. )   3d h/o fever Tmax 101.8, dry hacking cough, body aches, sinus HA and tooth ache, rhinorrhea, nausea/vomiting and dyspnea after showering. "Cough sounds like a seal". Post tussive emesis. PNDrainage  No loss of taste/ smell, no ear pain.   No h/o asthma. No recent smoking.  Husband sick last weekend, daughter recently hospitalized with tonsillitis and sepsis.  Treating with advil, nasal saline, Emergen-C.   COVID vaccine x2.      Relevant past medical, surgical, family and social history reviewed and updated as indicated. Interim medical history since our last visit reviewed. Allergies and medications reviewed and updated. Outpatient Medications Prior to Visit  Medication Sig Dispense Refill   budesonide (RHINOCORT ALLERGY) 32 MCG/ACT nasal spray Place 2 sprays into both nostrils daily. 15 mL 3   IBUPROFEN PO Take by mouth as needed.     LORazepam (ATIVAN) 0.5 MG tablet Take 1 tablet (0.5 mg total) by mouth daily as needed for anxiety. 30 tablet 0   VITAMIN D PO Take 1 tablet by mouth daily.     azithromycin (ZITHROMAX) 250 MG tablet 2 tab po x 1 day then 1 tab po daily 6 tablet 0   cetirizine (ZYRTEC) 10 MG tablet Take 1 tablet (10 mg total) by mouth daily. 90 tablet 3   omeprazole (PRILOSEC) 20 MG capsule Take 1 capsule (20 mg total) by mouth daily. 90 capsule 3   predniSONE (DELTASONE) 10 MG tablet  3 tabs by mouth daily x 3 days, then 2 tabs by mouth daily x 2 days then 1 tab by mouth daily x 2 days 15 tablet 0   triamcinolone cream (KENALOG) 0.1 % Apply 1 application topically 2 (two) times daily. 30 g 0   No facility-administered medications prior to visit.     Per HPI unless specifically indicated in ROS section below Review of Systems  Objective:  BP 124/76   Pulse (!) 107   Temp 97.7 F (36.5 C) (Temporal)   Ht 5' 6.25" (1.683 m)   Wt 160 lb 4 oz (72.7 kg)   LMP 04/09/2012   SpO2 97%   BMI 25.67 kg/m   Wt Readings from Last 3 Encounters:  01/29/23 160 lb 4 oz (72.7 kg)  04/25/22 169 lb (76.7 kg)  02/28/22 167 lb 8 oz (76 kg)      Physical Exam Vitals and nursing note reviewed.  Constitutional:      Appearance: Normal appearance. She is not ill-appearing.  HENT:     Head: Normocephalic and atraumatic.     Right Ear: Tympanic membrane, ear canal and external ear normal. There is no impacted cerumen.     Left Ear: Tympanic membrane, ear canal and external ear normal. There is no impacted cerumen.  Mouth/Throat:     Comments: Wearing mask Eyes:     Extraocular Movements: Extraocular movements intact.     Conjunctiva/sclera: Conjunctivae normal.     Pupils: Pupils are equal, round, and reactive to light.  Cardiovascular:     Rate and Rhythm: Normal rate and regular rhythm.     Pulses: Normal pulses.     Heart sounds: Normal heart sounds. No murmur heard. Pulmonary:     Effort: Pulmonary effort is normal. No respiratory distress.     Breath sounds: Normal breath sounds. No wheezing, rhonchi or rales.     Comments: Cough with fits present present throughout exam Musculoskeletal:     Cervical back: Normal range of motion and neck supple.  Lymphadenopathy:     Head:     Right side of head: No submental, submandibular, tonsillar, preauricular or posterior auricular adenopathy.     Left side of head: No submental, submandibular, tonsillar, preauricular or  posterior auricular adenopathy.     Cervical: No cervical adenopathy.     Right cervical: No superficial cervical adenopathy.    Left cervical: No superficial cervical adenopathy.     Upper Body:     Right upper body: No supraclavicular adenopathy.     Left upper body: No supraclavicular adenopathy.  Skin:    Findings: No rash.  Neurological:     Mental Status: She is alert.  Psychiatric:        Mood and Affect: Mood normal.        Behavior: Behavior normal.       Results for orders placed or performed in visit on 01/29/23  POC COVID-19 BinaxNow  Result Value Ref Range   SARS Coronavirus 2 Ag Positive (A) Negative  POCT Influenza A/B  Result Value Ref Range   Influenza A, POC Negative Negative   Influenza B, POC Negative Negative    Assessment & Plan:   Problem List Items Addressed This Visit     COVID-19 virus infection - Primary    Reviewed currently approved antiviral treatments.  Reviewed expected course of illness, anticipated course of recovery, as well as red flags to suggest COVID pneumonia and/or to seek urgent in-person care. Reviewed CDC isolation/quarantine guidelines.  Encouraged fluids and rest. Reviewed further supportive care measures at home including vit C 500mg  bid, vit D 2000 IU daily, zinc 100mg  daily, tylenol PRN, pepcid 20mg  BID PRN.  Recommend:  Reviewed overall low risk - recommend no antiviral treatment at this time. If worsening symptoms over next 1-2 days, to let us know.  Cheratussin for cough       Other Visit Diagnoses     Fever, unspecified fever cause       Relevant Orders   POC COVID-19 BinaxNow (Completed)   POCT Influenza A/B (Completed)   Acute cough       Relevant Orders   POC COVID-19 BinaxNow (Completed)        Meds ordered this encounter  Medications   guaiFENesin-codeine (ROBITUSSIN AC) 100-10 MG/5ML syrup    Sig: Take 5 mLs by mouth 3 (three) times daily as needed for cough.    Dispense:  120 mL    Refill:  0     Orders Placed This Encounter  Procedures   POC COVID-19 BinaxNow    Order Specific Question:   Previously tested for COVID-19    Answer:   Yes    Order Specific Question:   Resident in a congregate (group) care setting    Answer:  No    Order Specific Question:   Employed in healthcare setting    Answer:   No    Order Specific Question:   Pregnant    Answer:   No   POCT Influenza A/B    Patient Instructions  You have COVID infection  Recommend stay at home in isolation if able (use separate bathroom, bedroom from unaffected individuals at home) until feeling better and fever free for 24 hours off fever reducing medicine then may leave the house, but recommend mask wearing for another 5 days when around anyone.  Push fluids and rest. Consider vit C 500mg  twice daily, vit D 2000 IU daily, zinc 100mg  daily, tylenol as needed Let us know if any worsening symptoms to consider Paxlovid antiviral.   Follow up plan: Return if symptoms worsen or fail to improve.  Eustaquio Boyden, MD

## 2023-01-29 NOTE — Assessment & Plan Note (Addendum)
Reviewed currently approved antiviral treatments.  Reviewed expected course of illness, anticipated course of recovery, as well as red flags to suggest COVID pneumonia and/or to seek urgent in-person care. Reviewed CDC isolation/quarantine guidelines.  Encouraged fluids and rest. Reviewed further supportive care measures at home including vit C 500mg  bid, vit D 2000 IU daily, zinc 100mg  daily, tylenol PRN, pepcid 20mg  BID PRN.  Recommend:  Reviewed overall low risk - recommend no antiviral treatment at this time. If worsening symptoms over next 1-2 days, to let us know.  Cheratussin for cough

## 2023-07-05 ENCOUNTER — Ambulatory Visit: Payer: Commercial Managed Care - PPO | Admitting: Family Medicine

## 2023-07-05 ENCOUNTER — Encounter: Payer: Self-pay | Admitting: Family Medicine

## 2023-07-05 VITALS — BP 120/80 | HR 73 | Temp 97.8°F | Ht 66.25 in | Wt 175.0 lb

## 2023-07-05 DIAGNOSIS — L239 Allergic contact dermatitis, unspecified cause: Secondary | ICD-10-CM | POA: Diagnosis not present

## 2023-07-05 DIAGNOSIS — J029 Acute pharyngitis, unspecified: Secondary | ICD-10-CM | POA: Diagnosis not present

## 2023-07-05 DIAGNOSIS — J020 Streptococcal pharyngitis: Secondary | ICD-10-CM | POA: Diagnosis not present

## 2023-07-05 LAB — POCT RAPID STREP A (OFFICE): Rapid Strep A Screen: POSITIVE — AB

## 2023-07-05 MED ORDER — AMOXICILLIN 500 MG PO CAPS: 1000.0000 mg | ORAL_CAPSULE | Freq: Two times a day (BID) | ORAL | 0 refills | Status: DC

## 2023-07-05 MED ORDER — TRIAMCINOLONE ACETONIDE 0.5 % EX CREA: 1.0000 | TOPICAL_CREAM | Freq: Two times a day (BID) | CUTANEOUS | 0 refills | Status: AC

## 2023-07-05 NOTE — Assessment & Plan Note (Signed)
 Acute, will treat with amoxicillin 500 mg 2 capsules twice daily for strep pharyngitis.  Patient has had some hives with cephalexin in the past but states that she believes she has tolerated amoxicillin previously. Can use ibuprofen 800 mg p.o. 3 times daily as needed sore throat pain. Encouraged increasing hydration and p.o. intake to avoid dehydration..  Return and ER precautions provided.

## 2023-07-05 NOTE — Assessment & Plan Note (Signed)
 Acute, most consistent with contact or allergic dermatitis.  Patient with no history of recurrent rash. Will treat with triamcinolone 0.5 mg ointment twice daily for 2 weeks.  If rash not improving she will follow-up for further evaluation.

## 2023-07-05 NOTE — Progress Notes (Signed)
 Patient ID: [name], female    DOB: [birth year], 63 y.o.   MRN: [id number]  This visit was conducted in person.  BP 120/80 (BP Location: Right Arm, Patient Position: Sitting, Cuff Size: Normal)   Pulse 73   Temp 97.8 F (36.6 C) (Temporal)   Ht 5' 6.25" (1.683 m)   Wt 175 lb (79.4 kg)   LMP 04/09/2012   SpO2 98%   BMI 28.03 kg/m    CC:  Chief Complaint  Patient presents with   Sore Throat    X 1 week Negative Covid Test this morning   Adenopathy    Subjective:   HPI: [name] is a 62 y.o. female presenting on 07/05/2023 for Sore Throat (X 1 week/Negative Covid Test this morning) and Adenopathy   Date of onset: 3 days Initial symptoms included  chills, ST Symptoms progressed to severe ST, bioalteral ears hurt, neck swollen.  Trying to drink liquids.    Sick contacts:  grand baby with strep  COVID testing:   negative     She has tried to treat with  ibuprofen, tylenol     No history of chronic lung disease such as asthma or COPD. Non-smoker.  Also new rash in last few months... on left forearm.. itchy, dry flaky, red bumps.  Applied hydrocortisone.  No new exposures.   No other place.     Relevant past medical, surgical, family and social history reviewed and updated as indicated. Interim medical history since our last visit reviewed. Allergies and medications reviewed and updated. Outpatient Medications Prior to Visit  Medication Sig Dispense Refill   budesonide (RHINOCORT ALLERGY) 32 MCG/ACT nasal spray Place 2 sprays into both nostrils daily. 15 mL 3   IBUPROFEN PO Take by mouth as needed.     LORazepam (ATIVAN) 0.5 MG tablet Take 1 tablet (0.5 mg total) by mouth daily as needed for anxiety. 30 tablet 0   VITAMIN D PO Take 1 tablet by mouth daily.     guaiFENesin-codeine (ROBITUSSIN AC) 100-10 MG/5ML syrup Take 5 mLs by mouth 3 (three) times daily as needed for cough. 120 mL 0   No facility-administered medications prior to visit.     Per  HPI unless specifically indicated in ROS section below Review of Systems  Constitutional:  Negative for fatigue and fever.  HENT:  Positive for ear pain and sore throat. Negative for congestion.   Eyes:  Negative for pain.  Respiratory:  Negative for cough and shortness of breath.   Cardiovascular:  Negative for chest pain, palpitations and leg swelling.  Gastrointestinal:  Negative for abdominal pain.  Genitourinary:  Negative for dysuria and vaginal bleeding.  Musculoskeletal:  Negative for back pain.  Neurological:  Negative for syncope, light-headedness and headaches.  Psychiatric/Behavioral:  Negative for dysphoric mood.    Objective:  BP 120/80 (BP Location: Right Arm, Patient Position: Sitting, Cuff Size: Normal)   Pulse 73   Temp 97.8 F (36.6 C) (Temporal)   Ht 5' 6.25" (1.683 m)   Wt 175 lb (79.4 kg)   LMP 04/09/2012   SpO2 98%   BMI 28.03 kg/m   Wt Readings from Last 3 Encounters:  07/05/23 175 lb (79.4 kg)  01/29/23 160 lb 4 oz (72.7 kg)  04/25/22 169 lb (76.7 kg)      Physical Exam Constitutional:      General: She is not in acute distress.    Appearance: She is well-developed. She is not ill-appearing or toxic-appearing.  HENT:     Head: Normocephalic.     Right Ear: Hearing, tympanic membrane, ear canal and external ear normal. Tympanic membrane is not erythematous, retracted or bulging.     Left Ear: Hearing, tympanic membrane, ear canal and external ear normal. Tympanic membrane is not erythematous, retracted or bulging.     Nose: No mucosal edema or rhinorrhea.     Right Sinus: No maxillary sinus tenderness or frontal sinus tenderness.     Left Sinus: No maxillary sinus tenderness or frontal sinus tenderness.     Mouth/Throat:     Pharynx: Uvula midline. Pharyngeal swelling and posterior oropharyngeal erythema present. No oropharyngeal exudate.     Tonsils: No tonsillar exudate. 2+ on the right. 2+ on the left.  Eyes:     General: Lids are normal. Lids  are everted, no foreign bodies appreciated.     Conjunctiva/sclera: Conjunctivae normal.     Pupils: Pupils are equal, round, and reactive to light.  Neck:     Thyroid: No thyroid mass or thyromegaly.     Vascular: No carotid bruit.     Trachea: Trachea normal.  Cardiovascular:     Rate and Rhythm: Normal rate and regular rhythm.     Pulses: Normal pulses.     Heart sounds: Normal heart sounds, S1 normal and S2 normal. No murmur heard.    No friction rub. No gallop.  Pulmonary:     Effort: Pulmonary effort is normal. No tachypnea or respiratory distress.     Breath sounds: Normal breath sounds. No decreased breath sounds, wheezing, rhonchi or rales.  Musculoskeletal:     Cervical back: Normal range of motion and neck supple.  Lymphadenopathy:     Cervical: Cervical adenopathy present.     Right cervical: Superficial cervical adenopathy present. No deep or posterior cervical adenopathy.    Left cervical: Superficial cervical adenopathy present. No deep or posterior cervical adenopathy.  Skin:    General: Skin is warm and dry.     Findings: Rash present.     Comments: Dry patches with slight erythema and scab/excoriation on left forearm.  Neurological:     Mental Status: She is alert.  Psychiatric:        Mood and Affect: Mood is not anxious or depressed.        Speech: Speech normal.        Behavior: Behavior normal. Behavior is cooperative.        Judgment: Judgment normal.       Results for orders placed or performed in visit on 07/05/23  POCT rapid strep A   Collection Time: 07/05/23  2:35 PM  Result Value Ref Range   Rapid Strep A Screen Positive (A) Negative    Assessment and Plan  Strep pharyngitis Assessment & Plan: Acute, will treat with amoxicillin 500 mg 2 capsules twice daily for strep pharyngitis.  Patient has had some hives with cephalexin in the past but states that she believes she has tolerated amoxicillin previously. Can use ibuprofen 800 mg p.o. 3 times  daily as needed sore throat pain. Encouraged increasing hydration and p.o. intake to avoid dehydration..  Return and ER precautions provided.    Allergic dermatitis Assessment & Plan: Acute, most consistent with contact or allergic dermatitis.  Patient with no history of recurrent rash. Will treat with triamcinolone 0.5 mg ointment twice daily for 2 weeks.  If rash not improving she will follow-up for further evaluation.   Sore throat -  POCT rapid strep A  Other orders -     Amoxicillin; Take 2 capsules (1,000 mg total) by mouth 2 (two) times daily.  Dispense: 40 capsule; Refill: 0 -     Triamcinolone Acetonide; Apply 1 Application topically 2 (two) times daily.  Dispense: 30 g; Refill: 0    No follow-ups on file.   Kerby Nora, MD

## 2023-10-05 ENCOUNTER — Encounter: Payer: Self-pay | Admitting: Family Medicine

## 2023-11-15 DIAGNOSIS — N390 Urinary tract infection, site not specified: Secondary | ICD-10-CM | POA: Diagnosis not present

## 2023-11-28 ENCOUNTER — Telehealth: Admitting: Physician Assistant

## 2023-11-28 ENCOUNTER — Encounter: Payer: Self-pay | Admitting: Family Medicine

## 2023-11-28 DIAGNOSIS — R3989 Other symptoms and signs involving the genitourinary system: Secondary | ICD-10-CM

## 2023-11-28 DIAGNOSIS — T50995A Adverse effect of other drugs, medicaments and biological substances, initial encounter: Secondary | ICD-10-CM | POA: Diagnosis not present

## 2023-11-28 DIAGNOSIS — R300 Dysuria: Secondary | ICD-10-CM | POA: Diagnosis not present

## 2023-11-28 DIAGNOSIS — T7840XA Allergy, unspecified, initial encounter: Secondary | ICD-10-CM

## 2023-11-28 DIAGNOSIS — R21 Rash and other nonspecific skin eruption: Secondary | ICD-10-CM | POA: Diagnosis not present

## 2023-11-28 DIAGNOSIS — R319 Hematuria, unspecified: Secondary | ICD-10-CM | POA: Diagnosis not present

## 2023-11-28 DIAGNOSIS — N390 Urinary tract infection, site not specified: Secondary | ICD-10-CM | POA: Diagnosis not present

## 2023-11-28 MED ORDER — SULFAMETHOXAZOLE-TRIMETHOPRIM 800-160 MG PO TABS: 1.0000 | ORAL_TABLET | Freq: Two times a day (BID) | ORAL | 0 refills | Status: DC

## 2023-11-28 NOTE — Patient Instructions (Signed)
  [name], thank you for joining Teena Shuck, PA-C for today's virtual visit.  While this provider is not your primary care provider (PCP), if your PCP is located in our provider database this encounter information will be shared with them immediately following your visit.   A [facility] MyChart account gives you access to today's visit and all your visits, tests, and labs performed at [facility]  click here if you don't have a [facility] MyChart account or go to [email or web address]  Consent: (Patient) CHESNEE FLOREN provided verbal consent for this virtual visit at the beginning of the encounter.  Current Medications:  Current Outpatient Medications:    amoxicillin  (AMOXIL ) 500 MG capsule, Take 2 capsules (1,000 mg total) by mouth 2 (two) times daily., Disp: 40 capsule, Rfl: 0   budesonide  (RHINOCORT  ALLERGY) 32 MCG/ACT nasal spray, Place 2 sprays into both nostrils daily., Disp: 15 mL, Rfl: 3   IBUPROFEN PO, Take by mouth as needed., Disp: , Rfl:    LORazepam  (ATIVAN ) 0.5 MG tablet, Take 1 tablet (0.5 mg total) by mouth daily as needed for anxiety., Disp: 30 tablet, Rfl: 0   triamcinolone  cream (KENALOG ) 0.5 %, Apply 1 Application topically 2 (two) times daily., Disp: 30 g, Rfl: 0   VITAMIN D  PO, Take 1 tablet by mouth daily., Disp: , Rfl:    Medications ordered in this encounter:  No orders of the defined types were placed in this encounter.    *If you need refills on other medications prior to your next appointment, please contact your pharmacy*  Follow-Up: Call back or seek an in-person evaluation if the symptoms worsen or if the condition fails to improve as anticipated.  [facility] [phone]  Other Instructions Please report to the nearest Emergency room with any worsening symptoms. Follow up with primary care provider (PCP) in 2 -3 days.    If you have been instructed to have an in-person evaluation today at a local Urgent Care  facility, please use the link below. It will take you to a list of all of our available [facility], including address, phone number and hours of operation. Please do not delay care.  [facility]  If you or a family member do not have a primary care provider, use the link below to schedule a visit and establish care. When you choose a [facility] primary care physician or advanced practice provider, you gain a long-term partner in health. Find a Primary Care Provider  Learn more about [facility]'s in-office and virtual care options:  - Get Care Now

## 2023-11-28 NOTE — Progress Notes (Signed)
 Virtual Visit Consent   [name], you are scheduled for a virtual visit with a [facility] provider today. Just as with appointments in the office, your consent must be obtained to participate. Your consent will be active for this visit and any virtual visit you may have with one of our providers in the next 365 days. If you have a MyChart account, a copy of this consent can be sent to you electronically.  As this is a virtual visit, video technology does not allow for your provider to perform a traditional examination. This may limit your provider's ability to fully assess your condition. If your provider identifies any concerns that need to be evaluated in person or the need to arrange testing (such as labs, EKG, etc.), we will make arrangements to do so. Although advances in technology are sophisticated, we cannot ensure that it will always work on either your end or our end. If the connection with a video visit is poor, the visit may have to be switched to a telephone visit. With either a video or telephone visit, we are not always able to ensure that we have a secure connection.  By engaging in this virtual visit, you consent to the provision of healthcare and authorize for your insurance to be billed (if applicable) for the services provided during this visit. Depending on your insurance coverage, you may receive a charge related to this service.  I need to obtain your verbal consent now. Are you willing to proceed with your visit today? [name] has provided verbal consent on 11/28/2023 for a virtual visit (video or telephone). Teena Shuck, [place]  Date: 11/28/2023 4:57 PM   Virtual Visit via Video Note   I, Teena Shuck, connected with  [name]  ([id number], [birth year]) on 11/28/23 at  5:00 PM EDT by a video-enabled telemedicine application and verified that I am speaking with the correct person using two identifiers.  Location: Patient: Virtual Visit Location Patient:  Home Provider: Virtual Visit Location Provider: Home Office   I discussed the limitations of evaluation and management by telemedicine and the availability of in person appointments. The patient expressed understanding and agreed to proceed.    History of Present Illness: [name] is a 63 y.o. who identifies as a female who was assigned female at birth, and is being seen today for UTI symptoms.  HPI: Urinary Tract Infection  This is a new problem. The current episode started yesterday. The problem occurs every urination. The problem has been rapidly worsening. The quality of the pain is described as burning. The pain is mild. There has been no fever. She is Not sexually active. There is No history of pyelonephritis. Pertinent negatives include no frequency, hematuria or urgency. She has tried antibiotics for the symptoms. The treatment provided mild relief. Her past medical history is significant for recurrent UTIs.    Problems:  Patient Active Problem List   Diagnosis Date Noted   Strep pharyngitis 07/05/2023   Allergic dermatitis 07/05/2023   COVID-19 virus infection 01/29/2023   Nipple discharge 04/25/2022   Alopecia 12/29/2021   Screening, iron deficiency anemia 12/29/2021   Infected cyst of skin 10/06/2021   Cough, persistent 01/05/2021   Irritant contact dermatitis due to other agents 12/02/2020   Tinnitus of both ears 10/16/2019   Post concussive syndrome 10/16/2019   Right hip pain 08/11/2019   GERD (gastroesophageal reflux disease) 04/19/2017   MDD (recurrent major depressive disorder) in remission (HCC) 08/27/2014  Viral sinusitis 03/10/2013   Uterine fibroid 10/06/2010   Allergic rhinitis 07/07/2008   ABNORMAL PAP SMEAR 02/28/2007   GASTROINTESTINAL HEMORRHAGE, HX OF 02/28/2007    Allergies:  Allergies  Allergen Reactions   Cefuroxime Axetil Hives and Itching   Ciprofloxacin  Hcl Swelling    Swelling of lips   Medications:  Current Outpatient Medications:     amoxicillin  (AMOXIL ) 500 MG capsule, Take 2 capsules (1,000 mg total) by mouth 2 (two) times daily., Disp: 40 capsule, Rfl: 0   budesonide  (RHINOCORT  ALLERGY) 32 MCG/ACT nasal spray, Place 2 sprays into both nostrils daily., Disp: 15 mL, Rfl: 3   IBUPROFEN PO, Take by mouth as needed., Disp: , Rfl:    LORazepam  (ATIVAN ) 0.5 MG tablet, Take 1 tablet (0.5 mg total) by mouth daily as needed for anxiety., Disp: 30 tablet, Rfl: 0   triamcinolone  cream (KENALOG ) 0.5 %, Apply 1 Application topically 2 (two) times daily., Disp: 30 g, Rfl: 0   VITAMIN D  PO, Take 1 tablet by mouth daily., Disp: , Rfl:   Observations/Objective: Patient is well-developed, well-nourished in no acute distress.  Resting comfortably  at home.  Head is normocephalic, atraumatic.  No labored breathing.  Speech is clear and coherent with logical content.  Patient is alert and oriented at baseline.    Assessment and Plan: 1. Suspected UTI (Primary)  Patient presenting with urinary frequency and urgency.   I also considered PID, pregnancy, ectopic pregnancy, UTI, endometriosis, BV , tubovarian abscess, appendicitis, yeast vagnitis,  and pyelonephritis, but this appears less likely considering the data gathered thus far.  Medication prescribed. I have instructed the patient to present to the nearest ER at any time if there are any new or worsening symptoms.  The patient expressed understanding of and agreement with this plan.  Opportunity was given for questions prior to discharge and all stated questions were answered to the patient's satisfaction.   Follow Up Instructions: I discussed the assessment and treatment plan with the patient. The patient was provided an opportunity to ask questions and all were answered. The patient agreed with the plan and demonstrated an understanding of the instructions.  A copy of instructions were sent to the patient via MyChart unless otherwise noted below.    The patient was advised to call  back or seek an in-person evaluation if the symptoms worsen or if the condition fails to improve as anticipated.    Teena Shuck, PA-C

## 2023-11-28 NOTE — Progress Notes (Signed)
 Virtual Visit Consent   [name], you are scheduled for a virtual visit with a [facility] provider today. Just as with appointments in the office, your consent must be obtained to participate. Your consent will be active for this visit and any virtual visit you may have with one of our providers in the next 365 days. If you have a MyChart account, a copy of this consent can be sent to you electronically.  As this is a virtual visit, video technology does not allow for your provider to perform a traditional examination. This may limit your provider's ability to fully assess your condition. If your provider identifies any concerns that need to be evaluated in person or the need to arrange testing (such as labs, EKG, etc.), we will make arrangements to do so. Although advances in technology are sophisticated, we cannot ensure that it will always work on either your end or our end. If the connection with a video visit is poor, the visit may have to be switched to a telephone visit. With either a video or telephone visit, we are not always able to ensure that we have a secure connection.  By engaging in this virtual visit, you consent to the provision of healthcare and authorize for your insurance to be billed (if applicable) for the services provided during this visit. Depending on your insurance coverage, you may receive a charge related to this service.  I need to obtain your verbal consent now. Are you willing to proceed with your visit today? MAKAILYN MCCORMICK has provided verbal consent on 11/28/2023 for a virtual visit (video or telephone). Teena Shuck, [place]  Date: 11/28/2023 7:32 PM   Virtual Visit via Video Note   I, Teena Shuck, connected with  [name]  ([id number], 17-Apr-1961) on 11/28/23 at  7:30 PM EDT by a video-enabled telemedicine application and verified that I am speaking with the correct person using two identifiers.  Location: Patient: Virtual Visit Location Patient: Other:  at [facility] Provider: Virtual Visit Location Provider: Home Office   I discussed the limitations of evaluation and management by telemedicine and the availability of in person appointments. The patient expressed understanding and agreed to proceed.    History of Present Illness: [name] is a 63 y.o. who identifies as a female who was assigned female at birth, and is being seen today for patient presenting with diffuse urticaria and itching after bactrim . Airway patent no respiratory distress. [name]  HPI: Allergic Reaction This is a new problem. The current episode started today. The problem occurs constantly. The problem has been rapidly worsening since onset. The problem is moderate. The patient was exposed to an antibiotic. Associated symptoms include itching and a rash. Pertinent negatives include no abdominal pain, chest pain, chest pressure, coughing, diarrhea, difficulty breathing, drooling, eye itching, eye redness, eye watering, globus sensation, hyperventilation, stridor, trouble swallowing, vomiting or wheezing. There is no swelling present. Past treatments include nothing. The treatment provided no relief.    Problems:  Patient Active Problem List   Diagnosis Date Noted   Strep pharyngitis 07/05/2023   Allergic dermatitis 07/05/2023   COVID-19 virus infection 01/29/2023   Nipple discharge 04/25/2022   Alopecia 12/29/2021   Screening, iron deficiency anemia 12/29/2021   Infected cyst of skin 10/06/2021   Cough, persistent 01/05/2021   Irritant contact dermatitis due to other agents 12/02/2020   Tinnitus of both ears 10/16/2019   Post concussive syndrome 10/16/2019   Right hip pain 08/11/2019  GERD (gastroesophageal reflux disease) 04/19/2017   MDD (recurrent major depressive disorder) in remission (HCC) 08/27/2014   Viral sinusitis 03/10/2013   Uterine fibroid 10/06/2010   Allergic rhinitis 07/07/2008   ABNORMAL PAP SMEAR 02/28/2007   GASTROINTESTINAL HEMORRHAGE, HX OF  02/28/2007    Allergies:  Allergies  Allergen Reactions   Bactrim  [Sulfamethoxazole -Trimethoprim ] Hives   Cefuroxime Axetil Hives and Itching   Ciprofloxacin  Hcl Swelling    Swelling of lips   Medications:  Current Outpatient Medications:    amoxicillin  (AMOXIL ) 500 MG capsule, Take 2 capsules (1,000 mg total) by mouth 2 (two) times daily., Disp: 40 capsule, Rfl: 0   budesonide  (RHINOCORT  ALLERGY) 32 MCG/ACT nasal spray, Place 2 sprays into both nostrils daily., Disp: 15 mL, Rfl: 3   IBUPROFEN PO, Take by mouth as needed., Disp: , Rfl:    LORazepam  (ATIVAN ) 0.5 MG tablet, Take 1 tablet (0.5 mg total) by mouth daily as needed for anxiety., Disp: 30 tablet, Rfl: 0   triamcinolone  cream (KENALOG ) 0.5 %, Apply 1 Application topically 2 (two) times daily., Disp: 30 g, Rfl: 0   VITAMIN D  PO, Take 1 tablet by mouth daily., Disp: , Rfl:   Observations/Objective: Patient is well-developed, well-nourished in no acute distress.  Resting comfortably  at home.  Head is normocephalic, atraumatic.  No labored breathing.  Speech is clear and coherent with logical content.  Patient is alert and oriented at baseline.  Diffuse spreading rash head to toe  Assessment and Plan: 1. Allergic reaction, initial encounter (Primary)  Patient presenting with acute onset of erythematous spreading rash with urticaria after bactrim . Counseled patient to discontinue med and due to the severity of the rash, she needs to present in person for evaluation. She verbalized understanding and is presenting to the nearest UC or emergency room.   Follow Up Instructions: I discussed the assessment and treatment plan with the patient. The patient was provided an opportunity to ask questions and all were answered. The patient agreed with the plan and demonstrated an understanding of the instructions.  A copy of instructions were sent to the patient via MyChart unless otherwise noted below.    The patient was advised to call  back or seek an in-person evaluation if the symptoms worsen or if the condition fails to improve as anticipated.    Teena Shuck, PA-C

## 2023-11-28 NOTE — Patient Instructions (Signed)
  [name], thank you for joining Teena Shuck, PA-C for today's virtual visit.  While this provider is not your primary care provider (PCP), if your PCP is located in our provider database this encounter information will be shared with them immediately following your visit.   A [facility] MyChart account gives you access to today's visit and all your visits, tests, and labs performed at [facility]  click here if you don't have a [facility] MyChart account or go to [email or web address]  Consent: (Patient) [name] provided verbal consent for this virtual visit at the beginning of the encounter.  Current Medications:  Current Outpatient Medications:    amoxicillin  (AMOXIL ) 500 MG capsule, Take 2 capsules (1,000 mg total) by mouth 2 (two) times daily., Disp: 40 capsule, Rfl: 0   budesonide  (RHINOCORT  ALLERGY) 32 MCG/ACT nasal spray, Place 2 sprays into both nostrils daily., Disp: 15 mL, Rfl: 3   IBUPROFEN PO, Take by mouth as needed., Disp: , Rfl:    LORazepam  (ATIVAN ) 0.5 MG tablet, Take 1 tablet (0.5 mg total) by mouth daily as needed for anxiety., Disp: 30 tablet, Rfl: 0   triamcinolone  cream (KENALOG ) 0.5 %, Apply 1 Application topically 2 (two) times daily., Disp: 30 g, Rfl: 0   VITAMIN D  PO, Take 1 tablet by mouth daily., Disp: , Rfl:    Medications ordered in this encounter:  No orders of the defined types were placed in this encounter.    *If you need refills on other medications prior to your next appointment, please contact your pharmacy*  Follow-Up: Call back or seek an in-person evaluation if the symptoms worsen or if the condition fails to improve as anticipated.  [facility] [phone]  Other Instructions Report to nearest Urgent Care or ER for evaluation.    If you have been instructed to have an in-person evaluation today at a local Urgent Care facility, please use the link below. It will take you to a list of all of our  available [facility], including address, phone number and hours of operation. Please do not delay care.  [facility]  If you or a family member do not have a primary care provider, use the link below to schedule a visit and establish care. When you choose a [facility] primary care physician or advanced practice provider, you gain a long-term partner in health. Find a Primary Care Provider  Learn more about [facility]'s in-office and virtual care options: [facility] - Get Care Now

## 2023-11-29 ENCOUNTER — Other Ambulatory Visit (HOSPITAL_COMMUNITY): Payer: Self-pay

## 2023-12-06 ENCOUNTER — Encounter: Payer: Self-pay | Admitting: Family Medicine

## 2023-12-06 ENCOUNTER — Ambulatory Visit: Admitting: Family Medicine

## 2023-12-06 VITALS — BP 110/80 | HR 92 | Temp 97.5°F | Ht 66.25 in | Wt 166.0 lb

## 2023-12-06 DIAGNOSIS — R3989 Other symptoms and signs involving the genitourinary system: Secondary | ICD-10-CM | POA: Insufficient documentation

## 2023-12-06 NOTE — Progress Notes (Signed)
 Patient ID: [name], female    DOB: 06-21-1960, 63 y.o.   MRN: [id number]  This visit was conducted in person.  BP 110/80   Pulse 92   Temp (!) 97.5 F (36.4 C) (Temporal)   Ht 5' 6.25 (1.683 m)   Wt 166 lb (75.3 kg)   LMP 04/09/2012   SpO2 97%   BMI 26.59 kg/m    CC:  Chief Complaint  Patient presents with   Recurrent UTI    2 UTI July 11 & July 23    Subjective:   HPI: [name] is a 62 y.o. female presenting on 12/06/2023 for Recurrent UTI (2 UTI July 11 & July 23)   She was seen at Urgent Care  Culture grew [facility]  Treated with Bactrim  x 3 days  Symptoms improved but still some burning.  Pushed water.  She was seen via video visit by Teena Shuck, PA on 7/24 Reported  continue burning with urination, no fever Treated AGAIN with Bactrim  double strength twice daily x 3 days  No urine sample obtained, no urine culture available   Had allergic reaction within hours of taking... rash and itching.  Seen at urgent Care... treated with benadryl and steroid. Changed to nitrofurantoin 100 mg BID x 7 days.   Today she reports continue bladder pressure and mild burning.  Resolved frequency and urgency.  No fever, N/V,  No flank    Prior to this  no UTI in years.  Relevant past medical, surgical, family and social history reviewed and updated as indicated. Interim medical history since our last visit reviewed. Allergies and medications reviewed and updated. Outpatient Medications Prior to Visit  Medication Sig Dispense Refill   budesonide  (RHINOCORT  ALLERGY) 32 MCG/ACT nasal spray Place 2 sprays into both nostrils daily. 15 mL 3   IBUPROFEN PO Take by mouth as needed.     LORazepam  (ATIVAN ) 0.5 MG tablet Take 1 tablet (0.5 mg total) by mouth daily as needed for anxiety. 30 tablet 0   nitrofurantoin, macrocrystal-monohydrate, (MACROBID) 100 MG capsule Take 100 mg by mouth 2 (two) times daily.     triamcinolone  cream (KENALOG ) 0.5 % Apply 1 Application  topically 2 (two) times daily. 30 g 0   VITAMIN D  PO Take 1 tablet by mouth daily.     amoxicillin  (AMOXIL ) 500 MG capsule Take 2 capsules (1,000 mg total) by mouth 2 (two) times daily. 40 capsule 0   No facility-administered medications prior to visit.     Per HPI unless specifically indicated in ROS section below Review of Systems  Constitutional:  Negative for fatigue and fever.  HENT:  Negative for congestion.   Eyes:  Negative for pain.  Respiratory:  Negative for cough and shortness of breath.   Cardiovascular:  Negative for chest pain, palpitations and leg swelling.  Gastrointestinal:  Positive for abdominal pain.  Genitourinary:  Positive for dysuria. Negative for vaginal bleeding.  Musculoskeletal:  Negative for back pain.  Neurological:  Negative for syncope, light-headedness and headaches.  Psychiatric/Behavioral:  Negative for dysphoric mood.    Objective:  BP 110/80   Pulse 92   Temp (!) 97.5 F (36.4 C) (Temporal)   Ht 5' 6.25 (1.683 m)   Wt 166 lb (75.3 kg)   LMP 04/09/2012   SpO2 97%   BMI 26.59 kg/m   Wt Readings from Last 3 Encounters:  12/06/23 166 lb (75.3 kg)  07/05/23 175 lb (79.4 kg)  01/29/23 160 lb 4  oz (72.7 kg)      Physical Exam Constitutional:      General: She is not in acute distress.    Appearance: Normal appearance. She is well-developed. She is not ill-appearing or toxic-appearing.  HENT:     Head: Normocephalic.     Right Ear: Hearing, tympanic membrane, ear canal and external ear normal. Tympanic membrane is not erythematous, retracted or bulging.     Left Ear: Hearing, tympanic membrane, ear canal and external ear normal. Tympanic membrane is not erythematous, retracted or bulging.     Nose: No mucosal edema or rhinorrhea.     Right Sinus: No maxillary sinus tenderness or frontal sinus tenderness.     Left Sinus: No maxillary sinus tenderness or frontal sinus tenderness.     Mouth/Throat:     Pharynx: Uvula midline.  Eyes:      General: Lids are normal. Lids are everted, no foreign bodies appreciated.     Conjunctiva/sclera: Conjunctivae normal.     Pupils: Pupils are equal, round, and reactive to light.  Neck:     Thyroid : No thyroid  mass or thyromegaly.     Vascular: No carotid bruit.     Trachea: Trachea normal.  Cardiovascular:     Rate and Rhythm: Normal rate and regular rhythm.     Pulses: Normal pulses.     Heart sounds: Normal heart sounds, S1 normal and S2 normal. No murmur heard.    No friction rub. No gallop.  Pulmonary:     Effort: Pulmonary effort is normal. No tachypnea or respiratory distress.     Breath sounds: Normal breath sounds. No decreased breath sounds, wheezing, rhonchi or rales.  Abdominal:     General: Bowel sounds are normal.     Palpations: Abdomen is soft.     Tenderness: There is no abdominal tenderness.  Musculoskeletal:     Cervical back: Normal range of motion and neck supple.  Skin:    General: Skin is warm and dry.     Findings: No rash.  Neurological:     Mental Status: She is alert.  Psychiatric:        Mood and Affect: Mood is not anxious or depressed.        Speech: Speech normal.        Behavior: Behavior normal. Behavior is cooperative.        Thought Content: Thought content normal.        Judgment: Judgment normal.       Results for orders placed or performed in visit on 07/05/23  POCT rapid strep A   Collection Time: 07/05/23  2:35 PM  Result Value Ref Range   Rapid Strep A Screen Positive (A) Negative    Assessment and Plan  Suspected UTI Assessment & Plan: Acute, Possible continued urinary tract infection versus residual urinary tract irritation from recent persistent UTI. She will complete nitrofurantoin last dose and we will check a urine culture to see if any bacteria grows. Recommended avoiding bladder irritants and pushing fluids.  Return and ER precautions reviewed in detail with patient.  Orders: -     Urine Culture    No  follow-ups on file.   Greig Ring, MD

## 2023-12-06 NOTE — Assessment & Plan Note (Signed)
 Acute, Possible continued urinary tract infection versus residual urinary tract irritation from recent persistent UTI. She will complete nitrofurantoin last dose and we will check a urine culture to see if any bacteria grows. Recommended avoiding bladder irritants and pushing fluids.  Return and ER precautions reviewed in detail with patient.

## 2023-12-07 LAB — URINE CULTURE
MICRO NUMBER:: 16776167
Result:: NO GROWTH
SPECIMEN QUALITY:: ADEQUATE

## 2023-12-10 ENCOUNTER — Ambulatory Visit: Payer: Self-pay | Admitting: Family Medicine

## 2023-12-11 ENCOUNTER — Telehealth: Payer: Self-pay | Admitting: *Deleted

## 2023-12-11 DIAGNOSIS — E7800 Pure hypercholesterolemia, unspecified: Secondary | ICD-10-CM

## 2023-12-11 NOTE — Telephone Encounter (Signed)
-----   Message from Harlene Du sent at 12/11/2023 12:20 PM EDT ----- Regarding: Lab Tues 12/24/23 Hello,  Patient is coming in for CPE labs on Tuesday 12/24/23. Can we get orders please.   Thanks

## 2023-12-24 ENCOUNTER — Other Ambulatory Visit

## 2023-12-27 ENCOUNTER — Encounter: Payer: Self-pay | Admitting: Family Medicine

## 2023-12-27 ENCOUNTER — Ambulatory Visit (INDEPENDENT_AMBULATORY_CARE_PROVIDER_SITE_OTHER): Admitting: Family Medicine

## 2023-12-27 VITALS — BP 98/60 | HR 105 | Temp 97.5°F | Ht 65.75 in | Wt 165.0 lb

## 2023-12-27 DIAGNOSIS — F3340 Major depressive disorder, recurrent, in remission, unspecified: Secondary | ICD-10-CM | POA: Diagnosis not present

## 2023-12-27 DIAGNOSIS — Z131 Encounter for screening for diabetes mellitus: Secondary | ICD-10-CM | POA: Diagnosis not present

## 2023-12-27 DIAGNOSIS — Z0000 Encounter for general adult medical examination without abnormal findings: Secondary | ICD-10-CM

## 2023-12-27 DIAGNOSIS — R000 Tachycardia, unspecified: Secondary | ICD-10-CM | POA: Insufficient documentation

## 2023-12-27 DIAGNOSIS — Z13220 Encounter for screening for lipoid disorders: Secondary | ICD-10-CM

## 2023-12-27 DIAGNOSIS — E7800 Pure hypercholesterolemia, unspecified: Secondary | ICD-10-CM | POA: Diagnosis not present

## 2023-12-27 DIAGNOSIS — K219 Gastro-esophageal reflux disease without esophagitis: Secondary | ICD-10-CM

## 2023-12-27 NOTE — Assessment & Plan Note (Signed)
 Had not noted reflux lately but has noted some intermittent nonexertional chest pressure.  She has increased soda lately so we will work on decreasing reflux triggers.  She can try daily proton pump inhibitor like Prilosec 20 mg daily for the next 2 to 4 weeks, if that dose is ineffective she can increase to 40 mg daily.  She will follow-up if the chest pressure is not improving for further evaluation.

## 2023-12-27 NOTE — Progress Notes (Signed)
 Patient ID: [name], female    DOB: [birth year], 63 y.o.   MRN: [id number]  This visit was conducted in person.  BP 98/60   Pulse (!) 105   Temp (!) 97.5 F (36.4 C) (Temporal)   Ht 5' 5.75 (1.67 m)   Wt 165 lb (74.8 kg)   LMP 04/09/2012   SpO2 97%   BMI 26.83 kg/m    CC:  Chief Complaint  Patient presents with   Annual Exam    Subjective:   HPI: [name] is a 63 y.o. female presenting on 12/27/2023 for Annual Exam   Having some GERD few day a week or more.  Drinking more soda.   HAs chest pressure off and on at rest  in last month.  Occ feeling fluttering in chest... occ waking her at nihgt  HR elevated today.  2018  heart monitor: sinus rhythm with occasional PVCs, ventricular bigeminy and ventricular trigeminy.   MDD:  Well controlled. NO longer needing alprazolam . Flowsheet Row Office Visit from 12/27/2023 in [facility] Total Score 0   Reviewed labs in detail with patient.  Diet: moderate  Exercise: Off and on Wt Readings from Last 3 Encounters:  12/27/23 165 lb (74.8 kg)  12/06/23 166 lb (75.3 kg)  07/05/23 175 lb (79.4 kg)   Body mass index is 26.83 kg/m.  BP Readings from Last 3 Encounters:  12/27/23 98/60  12/06/23 110/80  07/05/23 120/80    Elevated Cholesterol/prediabetes:Due for re-eval. Using medications without problems: Muscle aches:  Diet compliance: Exercise: Other complaints:  Vit D def: due for re-eval.     Relevant past medical, surgical, family and social history reviewed and updated as indicated. Interim medical history since our last visit reviewed. Allergies and medications reviewed and updated. Outpatient Medications Prior to Visit  Medication Sig Dispense Refill   budesonide  (RHINOCORT  ALLERGY) 32 MCG/ACT nasal spray Place 2 sprays into both nostrils daily. 15 mL 3   IBUPROFEN PO Take by mouth as needed.     LORazepam  (ATIVAN ) 0.5 MG tablet Take 1 tablet (0.5 mg total)  by mouth daily as needed for anxiety. 30 tablet 0   triamcinolone  cream (KENALOG ) 0.5 % Apply 1 Application topically 2 (two) times daily. 30 g 0   nitrofurantoin, macrocrystal-monohydrate, (MACROBID) 100 MG capsule Take 100 mg by mouth 2 (two) times daily. (Patient not taking: Reported on 12/27/2023)     VITAMIN D  PO Take 1 tablet by mouth daily. (Patient not taking: Reported on 12/27/2023)     No facility-administered medications prior to visit.     Per HPI unless specifically indicated in ROS section below Review of Systems  Constitutional:  Negative for fatigue and fever.  HENT:  Negative for congestion.   Eyes:  Negative for pain.  Respiratory:  Negative for cough and shortness of breath.   Cardiovascular:  Negative for chest pain, palpitations and leg swelling.  Gastrointestinal:  Negative for abdominal pain.  Genitourinary:  Negative for dysuria and vaginal bleeding.  Musculoskeletal:  Negative for back pain.  Neurological:  Negative for syncope, light-headedness and headaches.  Psychiatric/Behavioral:  Negative for dysphoric mood.    Objective:  BP 98/60   Pulse (!) 105   Temp (!) 97.5 F (36.4 C) (Temporal)   Ht 5' 5.75 (1.67 m)   Wt 165 lb (74.8 kg)   LMP 04/09/2012   SpO2 97%   BMI 26.83 kg/m   Wt Readings from  Last 3 Encounters:  12/27/23 165 lb (74.8 kg)  12/06/23 166 lb (75.3 kg)  07/05/23 175 lb (79.4 kg)      Physical Exam Constitutional:      General: She is not in acute distress.    Appearance: Normal appearance. She is well-developed. She is not ill-appearing or toxic-appearing.  HENT:     Head: Normocephalic.     Right Ear: Hearing, tympanic membrane, ear canal and external ear normal.     Left Ear: Hearing, tympanic membrane, ear canal and external ear normal.     Nose: Nose normal.  Eyes:     General: Lids are normal. Lids are everted, no foreign bodies appreciated.     Conjunctiva/sclera: Conjunctivae normal.     Pupils: Pupils are equal, round,  and reactive to light.  Neck:     Thyroid : No thyroid  mass or thyromegaly.     Vascular: No carotid bruit.     Trachea: Trachea normal.  Cardiovascular:     Rate and Rhythm: Normal rate and regular rhythm.     Heart sounds: Normal heart sounds, S1 normal and S2 normal. No murmur heard.    No gallop.  Pulmonary:     Effort: Pulmonary effort is normal. No respiratory distress.     Breath sounds: Normal breath sounds. No wheezing, rhonchi or rales.  Abdominal:     General: Bowel sounds are normal. There is no distension or abdominal bruit.     Palpations: Abdomen is soft. There is no fluid wave or mass.     Tenderness: There is no abdominal tenderness. There is no guarding or rebound.     Hernia: No hernia is present.  Genitourinary:    Exam position: Supine.     Labia:        Right: No rash, tenderness or lesion.        Left: No rash, tenderness or lesion.      Vagina: Normal.     Cervix: No cervical motion tenderness, discharge or friability.     Uterus: Not enlarged and not tender.      Adnexa:        Right: No mass, tenderness or fullness.         Left: No mass, tenderness or fullness.    Musculoskeletal:     Cervical back: Normal range of motion and neck supple.  Lymphadenopathy:     Cervical: No cervical adenopathy.  Skin:    General: Skin is warm and dry.     Findings: No rash.  Neurological:     Mental Status: She is alert.     Cranial Nerves: No cranial nerve deficit.     Sensory: No sensory deficit.  Psychiatric:        Mood and Affect: Mood is not anxious or depressed.        Speech: Speech normal.        Behavior: Behavior normal. Behavior is cooperative.        Judgment: Judgment normal.       Results for orders placed or performed in visit on 12/06/23  Urine Culture   Collection Time: 12/06/23 11:57 AM   Specimen: Urine  Result Value Ref Range   MICRO NUMBER: [id number]    SPECIMEN QUALITY: Adequate    Sample Source URINE    STATUS: FINAL    Result: No  Growth      COVID 19 screen:  No recent travel or known exposure to COVID19 The patient denies respiratory symptoms  of COVID 19 at this time. The importance of social distancing was discussed today.   Assessment and Plan   The patient's preventative maintenance and recommended screening tests for an annual wellness exam were reviewed in full today. Brought up to date unless services declined.  Counselled on the importance of diet, exercise, and its role in overall health and mortality. The patient's FH and SH was reviewed, including their home life, tobacco status, and drug and alcohol status.    Vaccines:  Tdap  uptodate.  Consider shingrix and prevnar 20.. opted out. Recommended flu vaccine Discussed COVID19 vaccine  booster side effects and benefits. Strongly encouraged the patient to get the vaccine. Questions answered.  PAP/DVE: 12/2021 nml and no HPV, repeat in 5 years...  MAMMO: 04/2022 low risk, will, schedule.  DEXA low risk.. consider age 82.  Colon: 10/28/2020 repeat in 10 years Hep C neg. Nonsmoker  STD screen: refused  Problem List Items Addressed This Visit     GERD (gastroesophageal reflux disease)   Had not noted reflux lately but has noted some intermittent nonexertional chest pressure.  She has increased soda lately so we will work on decreasing reflux triggers.  She can try daily proton pump inhibitor like Prilosec 20 mg daily for the next 2 to 4 weeks, if that dose is ineffective she can increase to 40 mg daily.  She will follow-up if the chest pressure is not improving for further evaluation.      MDD (recurrent major depressive disorder) in remission (HCC)   Resolved.       Tachycardia   Acute, occasional fluttering of heart.  She did have evidence of PVCs in the past on heart monitor along with bigeminy/trigeminy. Given she has recently increased caffeine/soda intake she will work on reducing this.  We will check labs for anemia and thyroid  issues.  If she  continues to note issues she will follow-up for EKG and possible ZIO monitor. Encouraged her to push fluids.      Relevant Orders   CBC with Differential/Platelet   TSH   Other Visit Diagnoses       Routine general medical examination at a health care facility    -  Primary     Hypercholesterolemia         Screening cholesterol level         Screening for diabetes mellitus           Labs for cholesterol, vit d, A1C already ordered. Greig Ring, MD

## 2023-12-27 NOTE — Assessment & Plan Note (Signed)
 Resolved

## 2023-12-27 NOTE — Assessment & Plan Note (Signed)
 Acute, occasional fluttering of heart.  She did have evidence of PVCs in the past on heart monitor along with bigeminy/trigeminy. Given she has recently increased caffeine/soda intake she will work on reducing this.  We will check labs for anemia and thyroid  issues.  If she continues to note issues she will follow-up for EKG and possible ZIO monitor. Encouraged her to push fluids.

## 2023-12-27 NOTE — Patient Instructions (Signed)
 Please call the location of your choice from the menu below to schedule your Mammogram and/or Bone Density appointment.    [facility]   [facility]                      Phone:  [phone] [address]                               [place], [place] [address]                                                             Services: Traditional and 3D Mammogram, Bone Density   [facility]                 Phone: [phone] [address]                                                       [place], [place] [address]    Service: Bone Density ONLY   *this site does NOT perform mammograms  [facility]                        Phone:  [phone] [address]                                  [place], [place] [address]                                            Services:  3D Mammogram and Bone Density    [facility]   Phone:  [phone]   [address]                                                                            [place], [place] [address]                                            Services: 3D Mammogram and Bone [facility])  Phone:  [phone]   [address]                        [place], [place] [address]  Services:  3D Mammogram and Bone Density

## 2024-04-06 ENCOUNTER — Ambulatory Visit: Payer: Self-pay

## 2024-04-06 NOTE — Telephone Encounter (Signed)
 FYI Only or Action Required?: FYI only for provider: appointment scheduled on 04/08/24.  Patient was last seen in primary care on 12/27/2023 by Avelina Greig BRAVO, MD.  Called Nurse Triage reporting Hip Pain.  Symptoms began several weeks ago.  Interventions attempted: Nothing.  Symptoms are: stable.  Triage Disposition: See PCP When Office is Open (Within 3 Days)  Patient/caregiver understands and will follow disposition?: Yes Reason for Disposition  [1] MODERATE pain (e.g., interferes with normal activities, limping) AND [2] present > 3 days  Answer Assessment - Initial Assessment Questions Patient states she has had bursitis a few years ago and received an injection and thinks its that again. Denies any recent injury.  1. LOCATION and RADIATION: Where is the pain located? Does the pain spread (shoot) anywhere else?     Right hip, denies radiation  2. QUALITY: What does the pain feel like?  (e.g., sharp, dull, aching, burning)     Aching  3. SEVERITY: How bad is the pain? What does it keep you from doing?   (Scale 1-10; or mild, moderate, severe)     Comes and goes, worse with movement at times 9/10   4. ONSET: When did the pain start? Does it come and go, or is it there all the time?     Few months ago  5. WORK OR EXERCISE: Has there been any recent work or exercise that involved this part of the body?      Denies  6. CAUSE: What do you think is causing the hip pain?      Bursitis  7. AGGRAVATING FACTORS: What makes the hip pain worse? (e.g., walking, climbing stairs, running)     Certain movements  8. OTHER SYMPTOMS: Do you have any other symptoms? (e.g., back pain, pain shooting down leg,  fever, rash)     Denies  Protocols used: Hip Pain-A-AH  Copied from CRM #[id number]. Topic: Clinical - Red Word Triage >> Apr 06, 2024  7:57 AM Carlyon D wrote: Red Word that prompted transfer to Nurse Triage: Right Hip pain off and on through out the day

## 2024-04-07 NOTE — Progress Notes (Unsigned)
     Errica Dutil T. Natausha Jungwirth, MD, CAQ Sports Medicine [facility] [address] [place], [address]  Phone: [phone]  FAX: [phone]  [name] - 63 y.o. female  MRN [id number]  Date of Birth: [birth year]  Date: 04/08/2024  PCP: Avelina Greig BRAVO, MD  Referral: Avelina Greig BRAVO, MD  No chief complaint on file.  Subjective:   [name] is a 63 y.o. very pleasant female patient with There is no height or weight on file to calculate BMI. who presents with the following:  Discussed the use of AI scribe software for clinical note transcription with the patient, who gave verbal consent to proceed.  Patient presents with R hip pain. History of Present Illness     Review of Systems is noted in the HPI, as appropriate  Objective:   LMP 04/09/2012   GEN: No acute distress; alert,appropriate. PULM: Breathing comfortably in no respiratory distress PSYCH: Normally interactive.   Laboratory and Imaging Data:  Assessment and Plan:   No diagnosis found. Assessment & Plan   Medication Management during today's office visit: No orders of the defined types were placed in this encounter.  There are no discontinued medications.  Orders placed today for conditions managed today: No orders of the defined types were placed in this encounter.   Disposition: No follow-ups on file.  [employer] One speech-to-text software was used for transcription in this dictation.  Possible transcriptional errors can occur using Animal nutritionist.   Signed,  Jacques DASEN. Holiday Mcmenamin, MD   Outpatient Encounter Medications as of 04/08/2024  Medication Sig   budesonide  (RHINOCORT  ALLERGY) 32 MCG/ACT nasal spray Place 2 sprays into both nostrils daily.   IBUPROFEN PO Take by mouth as needed.   LORazepam  (ATIVAN ) 0.5 MG tablet Take 1 tablet (0.5 mg total) by mouth daily as needed for anxiety.   triamcinolone  cream (KENALOG ) 0.5 % Apply 1 Application topically  2 (two) times daily.   No facility-administered encounter medications on file as of 04/08/2024.

## 2024-04-08 ENCOUNTER — Ambulatory Visit: Admitting: Family Medicine

## 2024-04-08 ENCOUNTER — Encounter: Payer: Self-pay | Admitting: Family Medicine

## 2024-04-08 VITALS — BP 122/80 | HR 82 | Temp 97.1°F | Ht 65.75 in | Wt 160.0 lb

## 2024-04-08 DIAGNOSIS — M7061 Trochanteric bursitis, right hip: Secondary | ICD-10-CM

## 2024-04-08 MED ORDER — TRIAMCINOLONE ACETONIDE 40 MG/ML IJ SUSP
40.0000 mg | Freq: Once | INTRAMUSCULAR | Status: AC
  Administered 2024-04-08: 40 mg via INTRA_ARTICULAR

## 2024-04-17 ENCOUNTER — Other Ambulatory Visit (HOSPITAL_BASED_OUTPATIENT_CLINIC_OR_DEPARTMENT_OTHER): Payer: Self-pay

## 2024-05-08 ENCOUNTER — Telehealth: Admitting: Family Medicine

## 2024-05-08 ENCOUNTER — Ambulatory Visit: Admitting: Family Medicine

## 2024-05-08 ENCOUNTER — Encounter: Payer: Self-pay | Admitting: Family Medicine

## 2024-05-08 VITALS — Ht 65.75 in

## 2024-05-08 DIAGNOSIS — R051 Acute cough: Secondary | ICD-10-CM | POA: Diagnosis not present

## 2024-05-08 MED ORDER — GUAIFENESIN-CODEINE 100-10 MG/5ML PO SOLN: 5.0000 mL | Freq: Four times a day (QID) | ORAL | 0 refills | Status: AC | PRN

## 2024-05-08 MED ORDER — AZITHROMYCIN 250 MG PO TABS: ORAL_TABLET | ORAL | 0 refills | Status: AC

## 2024-05-08 NOTE — Assessment & Plan Note (Signed)
 Acute, most likely initial viral infection now with concerns for bacterial superinfection.  New right ear pain and right sinus pain in the last 24 hours concerning for possible right otitis and sinus infection but unable to examine given virtual visit. Treat with azithromycin , continue Robitussin as needed. Will prescribe codeine  with guaifenesin  for use at night to help with cough at night.  If not improving as expected over the next 48 to 72 hours recommend in person evaluation. If severe shortness of breath go to the emergency room. Return and ER precautions provided.
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
## 2024-05-11 ENCOUNTER — Ambulatory Visit

## 2024-05-11 VITALS — BP 120/84 | HR 102 | Temp 98.2°F | Ht 65.75 in | Wt 160.0 lb

## 2024-05-11 DIAGNOSIS — R052 Subacute cough: Secondary | ICD-10-CM

## 2024-05-11 DIAGNOSIS — H6501 Acute serous otitis media, right ear: Secondary | ICD-10-CM | POA: Diagnosis not present

## 2024-05-11 MED ORDER — AMOXICILLIN-POT CLAVULANATE 875-125 MG PO TABS: 1.0000 | ORAL_TABLET | Freq: Two times a day (BID) | ORAL | 0 refills | Status: AC

## 2024-05-11 MED ORDER — PROMETHAZINE-DM 6.25-15 MG/5ML PO SYRP: 5.0000 mL | ORAL_SOLUTION | Freq: Every evening | ORAL | 0 refills | Status: AC

## 2024-05-11 NOTE — Patient Instructions (Addendum)
 Thank you for visiting [facility] today! Here's what we talked about: - START Augmentin  - START promethazine  syrup at night time only  - STOP your other cough medication

## 2024-05-11 NOTE — Progress Notes (Signed)
 "  Subjective:   This visit was conducted in person. The patient gave informed consent to the use of Abridge AI technology to record the contents of the encounter as documented below.   Patient ID: [name], female    DOB: 09/07/1960, 65 y.o.   MRN: [id number]   Discussed the use of AI scribe software for clinical note transcription with the patient, who gave verbal consent to proceed.  History of Present Illness [name] is a 64 year old female who presents with right ear fullness and ringing.  She has been experiencing fullness and ringing in her right ear since May 08, 2024. The sensation is described as 'clogged up' with constant ringing that causes an echo effect. She has a history of a concussion in 2021, which resulted in intermittent ringing, but the current symptoms are more pronounced and localized to the right ear.  She was prescribed azithromycin  (Z-Pak) and has been taking it as directed, with the last dose scheduled for tomorrow. Despite this, she continues to experience fullness and ringing in the right ear. She also reports a pink discharge from the right ear that started on Friday, which later turned clear. There is no pain in the right ear, only fullness. The left ear is unaffected with no pain, fullness, or ringing.  She has experienced balance issues since yesterday, feeling off balance with sudden movements. She regularly cleans her ears with Q-tips but has avoided doing so this week due to her symptoms.  She also reports a persistent cough that began on Christmas Eve, described as a 'seal cough' that is unimproved and keeps her up at night. She has been using a cough syrup at night due to its sedative effects, but it has not been effective in controlling the cough. No fever, chills, difficulty breathing, or chest pain are reported.      Review of Systems  All other systems reviewed and are negative.       Allergies[1]  Medications Ordered Prior to  Encounter[2]  BP 120/84 (BP Location: Left Arm, Patient Position: Sitting, Cuff Size: Large)   Pulse (!) 102   Temp 98.2 F (36.8 C) (Oral)   Ht 5' 5.75 (1.67 m)   Wt 160 lb (72.6 kg)   LMP 04/09/2012   SpO2 97%   BMI 26.02 kg/m   Objective:      Physical Exam GENERAL: Alert, cooperative, well developed, no acute distress. HEAD: Normocephalic atraumatic. EYES: Extraocular movements intact bilaterally, pupils round, equal and reactive to light bilaterally, conjunctivae normal bilaterally. EARS: Right tympanic membrane erythematous, bulging, left tympanic membrane appears transparent, left ear canal clear,  NOSE: No congestion or rhinorrhea, mucous membranes are moist. THROAT: No oropharyngeal exudate or posterior oropharyngeal erythema. CARDIOVASCULAR: Normal heart rate and rhythm, S1 and S2 normal without murmurs. CHEST: Clear to auscultation bilaterally, no wheezes, rhonchi, or crackles. ABDOMEN: Soft, non tender, non distended, without organomegaly, normal bowel sounds. EXTREMITIES: No cyanosis or edema. NEUROLOGICAL: Oriented to person, place and time, no gait abnormalities, moves all extremities without gross motor or sensory deficit.         Assessment & Plan:   Assessment & Plan Right acute otitis media Bulging eardrum with possible tympanic membrane rupture given otorrhea. Balance issues due to the infection. Of note, pt did recently received Z-pack 3d ago, will change to augmentin .   - Prescribed Augmentin  1 tablet twice daily for 7 days. - Recommended over-the-counter probiotic to mitigate gastrointestinal side effects. - Advised follow-up  in 7 days to assess improvement and consider extending antibiotic course.  Subacute cough Persistent productive cough, likely viral. - Discontinued current cough syrup given reported inefficacy. - Prescribed promethazine  and dextromethorphan syrup, 5 mL at night for up to 10 days. - Recommended non-sedative cough drops for  daytime use.   Return in about 1 week (around 05/18/2024) for R ear fullness with PCP. If PCP not available then myself.   Aaliyha Mumford K Shevelle Smither, MD  05/11/2024     Contains text generated by Abridge.         [1]  Allergies Allergen Reactions   Bactrim  [Sulfamethoxazole -Trimethoprim ] Hives   Cefuroxime Axetil Hives and Itching   Ciprofloxacin  Hcl Swelling    Swelling of lips  [2]  Current Outpatient Medications on File Prior to Visit  Medication Sig Dispense Refill   azithromycin  (ZITHROMAX ) 250 MG tablet Take 2 tablets on day 1, then 1 tablet daily on days 2 through 5 6 tablet 0   budesonide  (RHINOCORT  ALLERGY) 32 MCG/ACT nasal spray Place 2 sprays into both nostrils daily. 15 mL 3   guaiFENesin -codeine  100-10 MG/5ML syrup Take 5 mLs by mouth every 6 (six) hours as needed for cough. 118 mL 0   IBUPROFEN PO Take by mouth as needed.     LORazepam  (ATIVAN ) 0.5 MG tablet Take 1 tablet (0.5 mg total) by mouth daily as needed for anxiety. 30 tablet 0   triamcinolone  cream (KENALOG ) 0.5 % Apply 1 Application topically 2 (two) times daily. 30 g 0   No current facility-administered medications on file prior to visit.   "

## 2024-05-19 ENCOUNTER — Ambulatory Visit: Admitting: Family Medicine

## 2024-05-19 ENCOUNTER — Telehealth: Payer: Self-pay | Admitting: *Deleted

## 2024-05-19 ENCOUNTER — Encounter: Payer: Self-pay | Admitting: Family Medicine

## 2024-05-19 VITALS — BP 110/70 | HR 78 | Temp 97.5°F | Ht 65.75 in | Wt 159.0 lb

## 2024-05-19 DIAGNOSIS — R42 Dizziness and giddiness: Secondary | ICD-10-CM | POA: Insufficient documentation

## 2024-05-19 DIAGNOSIS — H6691 Otitis media, unspecified, right ear: Secondary | ICD-10-CM | POA: Insufficient documentation

## 2024-05-19 DIAGNOSIS — H9311 Tinnitus, right ear: Secondary | ICD-10-CM

## 2024-05-19 DIAGNOSIS — H7291 Unspecified perforation of tympanic membrane, right ear: Secondary | ICD-10-CM | POA: Diagnosis not present

## 2024-05-19 MED ORDER — FLUCONAZOLE 150 MG PO TABS: 150.0000 mg | ORAL_TABLET | Freq: Once | ORAL | 0 refills | Status: AC

## 2024-05-19 MED ORDER — AMOXICILLIN-POT CLAVULANATE 875-125 MG PO TABS: 1.0000 | ORAL_TABLET | Freq: Two times a day (BID) | ORAL | 0 refills | Status: AC

## 2024-05-19 MED ORDER — PREDNISONE 20 MG PO TABS: ORAL_TABLET | ORAL | 0 refills | Status: AC

## 2024-05-19 NOTE — Assessment & Plan Note (Signed)
 Acute worsening likely secondary to fluid/perforation.  Associated with complete hearing loss in right ear.

## 2024-05-19 NOTE — Telephone Encounter (Signed)
 [name] notified by telephone that referral was processed at 9:44 am.  She will reach out to ENT again to try and schedule appointment.

## 2024-05-19 NOTE — Assessment & Plan Note (Signed)
 Acute, persistent Given tinnitus, apparent perforation and persistent symptoms despite 2 courses of antibiotics will refer to ENT for additional recommendations. Will repeat course of Augmentin  (patient with beginnings of vaginal irritation so we will treat with fluconazole  for yeast) Will treat with prednisone  taper to help with eustachian tube dysfunction.

## 2024-05-19 NOTE — Progress Notes (Signed)
 "   Patient ID: [name], female    DOB: [birth year], 64 y.o.   MRN: [id number]  This visit was conducted in person.  BP 110/70   Pulse 78   Temp (!) 97.5 F (36.4 C) (Temporal)   Ht 5' 5.75 (1.67 m)   Wt 159 lb (72.1 kg)   LMP 04/09/2012   SpO2 97%   BMI 25.86 kg/m    CC:  Chief Complaint  Patient presents with   Ear Fullness    Follow up Right Ear   Tinnitus    Subjective:   HPI: [name] is a 64 y.o. female presenting on 05/19/2024 for Ear Fullness (Follow up Right Ear) and Tinnitus   Telehealth visit on 05/08/2024 for acute cough.. treated with azithromycin   Continue ear fullness and tinnitus... came in for in person visit. Seen on 05/11/2024 treated with Augmentin  for right serous otitis media.    Here today for follow up eval.  She reports she is feeling better overall. Still some fatigue, improved appetite.  She still has ringing and fullness/pressure in right ear. No hearing in right ear.  Turning head makes her dizzy.  No fever.  Cough essential resolved.  No SOB.   No longer needing  cough suppressant.   Intolerant of flonase.  Advil prn.  Nasal saline irrigation  Relevant past medical, surgical, family and social history reviewed and updated as indicated. Interim medical history since our last visit reviewed. Allergies and medications reviewed and updated. Outpatient Medications Prior to Visit  Medication Sig Dispense Refill   budesonide  (RHINOCORT  ALLERGY) 32 MCG/ACT nasal spray Place 2 sprays into both nostrils daily. 15 mL 3   guaiFENesin -codeine  100-10 MG/5ML syrup Take 5 mLs by mouth every 6 (six) hours as needed for cough. 118 mL 0   IBUPROFEN PO Take by mouth as needed.     LORazepam  (ATIVAN ) 0.5 MG tablet Take 1 tablet (0.5 mg total) by mouth daily as needed for anxiety. 30 tablet 0   promethazine -dextromethorphan (PROMETHAZINE -DM) 6.25-15 MG/5ML syrup Take 5 mLs by mouth at bedtime for 10 days. 118 mL 0   triamcinolone  cream (KENALOG ) 0.5 %  Apply 1 Application topically 2 (two) times daily. 30 g 0   No facility-administered medications prior to visit.     Per HPI unless specifically indicated in ROS section below Review of Systems  Constitutional:  Negative for fatigue, fever and unexpected weight change.  HENT:  Negative for congestion, ear pain, sinus pressure, sneezing, sore throat and trouble swallowing.   Eyes:  Negative for pain and itching.  Respiratory:  Negative for cough, shortness of breath and wheezing.   Cardiovascular:  Negative for chest pain, palpitations and leg swelling.  Gastrointestinal:  Negative for abdominal pain, blood in stool, constipation, diarrhea and nausea.  Genitourinary:  Negative for difficulty urinating, dysuria, hematuria, menstrual problem and vaginal discharge.  Skin:  Negative for rash.  Neurological:  Negative for syncope, weakness, light-headedness, numbness and headaches.  Psychiatric/Behavioral:  Negative for confusion and dysphoric mood. The patient is not nervous/anxious.    Objective:  BP 110/70   Pulse 78   Temp (!) 97.5 F (36.4 C) (Temporal)   Ht 5' 5.75 (1.67 m)   Wt 159 lb (72.1 kg)   LMP 04/09/2012   SpO2 97%   BMI 25.86 kg/m   Wt Readings from Last 3 Encounters:  05/19/24 159 lb (72.1 kg)  05/11/24 160 lb (72.6 kg)  04/08/24 160 lb (72.6 kg)  Physical Exam Constitutional:      General: She is not in acute distress.    Appearance: Normal appearance. She is well-developed. She is not ill-appearing or toxic-appearing.  HENT:     Head: Normocephalic.     Right Ear: Ear canal and external ear normal. Decreased hearing noted. Tenderness present. A middle ear effusion is present. Tympanic membrane is perforated, erythematous and retracted. Tympanic membrane is not bulging.     Left Ear: Hearing, tympanic membrane, ear canal and external ear normal. Tympanic membrane is not erythematous, retracted or bulging.     Nose: No mucosal edema or rhinorrhea.     Right  Sinus: No maxillary sinus tenderness or frontal sinus tenderness.     Left Sinus: No maxillary sinus tenderness or frontal sinus tenderness.     Mouth/Throat:     Pharynx: Uvula midline.  Eyes:     General: Lids are normal. Lids are everted, no foreign bodies appreciated.     Conjunctiva/sclera: Conjunctivae normal.     Pupils: Pupils are equal, round, and reactive to light.  Neck:     Thyroid : No thyroid  mass or thyromegaly.     Vascular: No carotid bruit.     Trachea: Trachea normal.  Cardiovascular:     Rate and Rhythm: Normal rate and regular rhythm.     Pulses: Normal pulses.     Heart sounds: Normal heart sounds, S1 normal and S2 normal. No murmur heard.    No friction rub. No gallop.  Pulmonary:     Effort: Pulmonary effort is normal. No tachypnea or respiratory distress.     Breath sounds: Normal breath sounds. No decreased breath sounds, wheezing, rhonchi or rales.  Abdominal:     General: Bowel sounds are normal.     Palpations: Abdomen is soft.     Tenderness: There is no abdominal tenderness.  Musculoskeletal:     Cervical back: Normal range of motion and neck supple.  Skin:    General: Skin is warm and dry.     Findings: No rash.  Neurological:     Mental Status: She is alert.  Psychiatric:        Mood and Affect: Mood is not anxious or depressed.        Speech: Speech normal.        Behavior: Behavior normal. Behavior is cooperative.        Thought Content: Thought content normal.        Judgment: Judgment normal.       Results for orders placed or performed in visit on 12/06/23  Urine Culture   Collection Time: 12/06/23 11:57 AM   Specimen: Urine  Result Value Ref Range   MICRO NUMBER: [id number]    SPECIMEN QUALITY: Adequate    Sample Source URINE    STATUS: FINAL    Result: No Growth     Assessment and Plan  Right otitis media with spontaneous rupture of eardrum Assessment & Plan: Acute, persistent Given tinnitus, apparent perforation and  persistent symptoms despite 2 courses of antibiotics will refer to ENT for additional recommendations. Will repeat course of Augmentin  (patient with beginnings of vaginal irritation so we will treat with fluconazole  for yeast) Will treat with prednisone  taper to help with eustachian tube dysfunction.  Orders: -     Ambulatory referral to ENT  Tinnitus of right ear Assessment & Plan: Acute worsening likely secondary to fluid/perforation.  Associated with complete hearing loss in right ear.  Orders: -  Ambulatory referral to ENT  Vertigo -     Ambulatory referral to ENT  Other orders -     predniSONE ; 3 tabs by mouth daily x 3 days, then 2 tabs by mouth daily x 2 days then 1 tab by mouth daily x 2 days  Dispense: 15 tablet; Refill: 0 -     Fluconazole ; Take 1 tablet (150 mg total) by mouth once for 1 dose.  Dispense: 2 tablet; Refill: 0 -     Amoxicillin -Pot Clavulanate; Take 1 tablet by mouth 2 (two) times daily.  Dispense: 20 tablet; Refill: 0    No follow-ups on file.   Greig Ring, MD  "

## 2024-05-19 NOTE — Telephone Encounter (Signed)
 Copied from CRM #[id number]. Topic: Referral - Status >> May 19, 2024 10:07 AM Wess RAMAN wrote: Reason for CRM: Patient wanted to speak with someone in referrals. Advised her that the referral coordinator does not work in the office and referrals may take 7-10 business days. She stated she will message Dr. Avelina and disconnected. >> May 19, 2024 10:32 AM Mercedes MATSU wrote: Patient called in stating that Dr. Avelina put a referral in for her to go to ENT and when she called them they do not have the referral still. And she was wondering when referral would be sent to them, she can be reached at [phone].
